# Patient Record
Sex: Female | Born: 1985 | Race: White | Hispanic: No | Marital: Single | Smoking: Never smoker
Health system: Southern US, Community
[De-identification: ages and names within clinical notes are randomized; demographics above are authoritative.]

## PROBLEM LIST (undated history)

## (undated) DIAGNOSIS — K5900 Constipation, unspecified: Secondary | ICD-10-CM

## (undated) DIAGNOSIS — R51 Headache: Secondary | ICD-10-CM

## (undated) DIAGNOSIS — H6690 Otitis media, unspecified, unspecified ear: Secondary | ICD-10-CM

## (undated) DIAGNOSIS — F329 Major depressive disorder, single episode, unspecified: Secondary | ICD-10-CM

## (undated) DIAGNOSIS — R5383 Other fatigue: Secondary | ICD-10-CM

## (undated) DIAGNOSIS — F319 Bipolar disorder, unspecified: Secondary | ICD-10-CM

## (undated) DIAGNOSIS — D649 Anemia, unspecified: Secondary | ICD-10-CM

## (undated) DIAGNOSIS — H73899 Other specified disorders of tympanic membrane, unspecified ear: Secondary | ICD-10-CM

## (undated) DIAGNOSIS — G4733 Obstructive sleep apnea (adult) (pediatric): Secondary | ICD-10-CM

## (undated) DIAGNOSIS — K219 Gastro-esophageal reflux disease without esophagitis: Secondary | ICD-10-CM

## (undated) DIAGNOSIS — R197 Diarrhea, unspecified: Secondary | ICD-10-CM

## (undated) DIAGNOSIS — R000 Tachycardia, unspecified: Secondary | ICD-10-CM

## (undated) DIAGNOSIS — K769 Liver disease, unspecified: Secondary | ICD-10-CM

## (undated) DIAGNOSIS — E282 Polycystic ovarian syndrome: Secondary | ICD-10-CM

## (undated) DIAGNOSIS — R12 Heartburn: Secondary | ICD-10-CM

## (undated) DIAGNOSIS — R519 Headache, unspecified: Secondary | ICD-10-CM

## (undated) DIAGNOSIS — F419 Anxiety disorder, unspecified: Secondary | ICD-10-CM

## (undated) DIAGNOSIS — F32A Depression, unspecified: Secondary | ICD-10-CM

## (undated) DIAGNOSIS — IMO0002 Reserved for concepts with insufficient information to code with codable children: Secondary | ICD-10-CM

## (undated) HISTORY — DX: Anemia, unspecified: D64.9

## (undated) HISTORY — PX: WISDOM TOOTH EXTRACTION: SHX21

## (undated) HISTORY — DX: Depression, unspecified: F32.A

## (undated) HISTORY — PX: COLONOSCOPY: SHX174

## (undated) HISTORY — DX: Major depressive disorder, single episode, unspecified: F32.9

## (undated) HISTORY — DX: Polycystic ovarian syndrome: E28.2

## (undated) HISTORY — DX: Other fatigue: R53.83

## (undated) HISTORY — DX: Gastro-esophageal reflux disease without esophagitis: K21.9

## (undated) HISTORY — DX: Other specified disorders of tympanic membrane, unspecified ear: H73.899

## (undated) HISTORY — DX: Obstructive sleep apnea (adult) (pediatric): G47.33

## (undated) HISTORY — PX: TYMPANOSTOMY TUBE PLACEMENT: SHX32

## (undated) HISTORY — DX: Reserved for concepts with insufficient information to code with codable children: IMO0002

## (undated) HISTORY — PX: UPPER GI ENDOSCOPY: SHX6162

## (undated) HISTORY — DX: Bipolar disorder, unspecified: F31.9

## (undated) HISTORY — DX: Anxiety disorder, unspecified: F41.9

## (undated) HISTORY — DX: Liver disease, unspecified: K76.9

---

## 2006-05-28 ENCOUNTER — Emergency Department (HOSPITAL_COMMUNITY): Admission: EM | Admit: 2006-05-28 | Discharge: 2006-05-28 | Payer: Self-pay | Admitting: Emergency Medicine

## 2006-08-12 ENCOUNTER — Other Ambulatory Visit: Admission: RE | Admit: 2006-08-12 | Discharge: 2006-08-12 | Payer: Self-pay | Admitting: Family Medicine

## 2007-10-14 ENCOUNTER — Other Ambulatory Visit: Admission: RE | Admit: 2007-10-14 | Discharge: 2007-10-14 | Payer: Self-pay | Admitting: Family Medicine

## 2008-07-31 ENCOUNTER — Ambulatory Visit: Payer: Self-pay | Admitting: Family Medicine

## 2010-01-01 ENCOUNTER — Ambulatory Visit: Payer: Self-pay | Admitting: Family Medicine

## 2010-01-01 DIAGNOSIS — D509 Iron deficiency anemia, unspecified: Secondary | ICD-10-CM

## 2010-01-31 ENCOUNTER — Ambulatory Visit: Payer: Self-pay | Admitting: Emergency Medicine

## 2010-01-31 DIAGNOSIS — K219 Gastro-esophageal reflux disease without esophagitis: Secondary | ICD-10-CM | POA: Insufficient documentation

## 2010-03-26 ENCOUNTER — Ambulatory Visit: Payer: Self-pay | Admitting: Family Medicine

## 2010-03-27 ENCOUNTER — Encounter (INDEPENDENT_AMBULATORY_CARE_PROVIDER_SITE_OTHER): Payer: Self-pay | Admitting: *Deleted

## 2010-03-28 ENCOUNTER — Telehealth (INDEPENDENT_AMBULATORY_CARE_PROVIDER_SITE_OTHER): Payer: Self-pay | Admitting: *Deleted

## 2010-07-07 ENCOUNTER — Ambulatory Visit
Admission: RE | Admit: 2010-07-07 | Discharge: 2010-07-07 | Payer: Self-pay | Source: Home / Self Care | Admitting: Family Medicine

## 2010-07-07 ENCOUNTER — Encounter: Payer: Self-pay | Admitting: Family Medicine

## 2010-07-07 LAB — CONVERTED CEMR LAB
Albumin: 5.7 g/dL — ABNORMAL HIGH (ref 3.5–5.2)
Alkaline Phosphatase: 96 units/L (ref 39–117)
Amylase: 25 units/L (ref 0–105)
BUN: 13 mg/dL (ref 6–23)
CO2: 18 meq/L — ABNORMAL LOW (ref 19–32)
Chloride: 102 meq/L (ref 96–112)
Glucose, Urine, Semiquant: NEGATIVE
Nitrite: NEGATIVE
Total Protein: 9.2 g/dL — ABNORMAL HIGH (ref 6.0–8.3)
WBC Urine, dipstick: NEGATIVE

## 2010-07-08 ENCOUNTER — Encounter: Payer: Self-pay | Admitting: Family Medicine

## 2010-07-08 NOTE — Progress Notes (Signed)
  Phone Note Outgoing Call Call back at [facility] Phone [phone]   Call placed by: Lajean Saver RN,  March 28, 2010 10:34 AM Call placed to: Patient Summary of Call: Call back: No answer. Message left on patient's personal cell phone with reason for call and call back with any questions.

## 2010-07-08 NOTE — Assessment & Plan Note (Signed)
Summary: VOMITTING/DIARRHEA 4 DAYS/NH   Vital Signs:  Patient Profile:   25 Years Old Female CC:      Vomiting and Diarrhea x 4 days Height:     60 inches Weight:      174 pounds O2 Sat:      99 % O2 treatment:    Room Air Temp:     98.3 degrees F oral Pulse rate:   90 / minute Pulse rhythm:   regular Resp:     14 per minute BP sitting:   118 / 82  (left arm) Cuff size:   regular  Vitals Entered By: Emilio Math (January 31, 2010 3:46 PM)                  Current Allergies (reviewed today): ! SULFA ! * DILAUDID ! * MRI DYEHistory of Present Illness Chief Complaint: Vomiting and Diarrhea x 4 days History of Present Illness: Diarrhea x4 days, Vomiting today, nausea x2 days.  Mild subj fever and chills.  Not taking any OTC meds. She has a h/o GERD and is out of her meds and is asking for a refill...  has been on Nexium and Aciphex before. The OTC meds like Zantac haven't been working well.  She just started back at [employer].  REVIEW OF SYSTEMS Constitutional Symptoms      Denies fever, chills, night sweats, weight loss, weight gain, and fatigue.  Eyes       Denies change in vision, eye pain, eye discharge, glasses, contact lenses, and eye surgery. Ear/Nose/Throat/Mouth       Denies hearing loss/aids, change in hearing, ear pain, ear discharge, dizziness, frequent runny nose, frequent nose bleeds, sinus problems, sore throat, hoarseness, and tooth pain or bleeding.  Respiratory       Denies dry cough, productive cough, wheezing, shortness of breath, asthma, bronchitis, and emphysema/COPD.  Cardiovascular       Denies murmurs, chest pain, and tires easily with exhertion.    Gastrointestinal       Complains of stomach pain, nausea/vomiting, and diarrhea.      Denies constipation, blood in bowel movements, and indigestion. Genitourniary       Denies painful urination, kidney stones, and loss of urinary control. Neurological       Denies paralysis, seizures, and  fainting/blackouts. Musculoskeletal       Denies muscle pain, joint pain, joint stiffness, decreased range of motion, redness, swelling, muscle weakness, and gout.  Skin       Denies bruising, unusual mles/lumps or sores, and hair/skin or nail changes.  Psych       Denies mood changes, temper/anger issues, anxiety/stress, speech problems, depression, and sleep problems.  Past History:  Past Medical History: Reviewed history from 01/01/2010 and no changes required. Anemia-iron deficiency Depression Bipolar Acid Reflux Slight Bulging disc  Past Surgical History: Reviewed history from 01/01/2010 and no changes required. Wisdom Teeth  Family History: Reviewed history from 01/01/2010 and no changes required. Family History of Anxiety Family History Breast cancer 1st degree relative <50 Family History Diabetes 1st degree relative Family History High cholesterol Family History Hypertension Family History Thyroid disease  Social History: Single Never Smoked Alcohol use-yes - once/twice a mth Drug use-no Regular exercise-no Student Physical Exam General appearance: well developed, well nourished, no acute distress Nasal: mucosa pink, nonedematous, no septal deviation, turbinates normal Oral/Pharynx: tongue normal, posterior pharynx without erythema or exudate Neck: neck supple,  trachea midline, no masses Chest/Lungs: no rales, wheezes, or rhonchi bilateral,  breath sounds equal without effort Heart: regular rate and  rhythm, no murmur Abdomen: epigastric tenderness, Murphy's and Mcburney's neg, no guarding, no rebound, soft Back: no tenderness over musculature, straight leg raises negative bilaterally, deep tendon reflexes 2+ at achilles and patella MSE: oriented to time, place, and person Assessment New Problems: GERD (ICD-530.81) GASTROENTERITIS, ACUTE (ICD-558.9)  likely viral gastroenteritis  Plan New Medications/Changes: PROMETHAZINE HCL 12.5 MG TABS (PROMETHAZINE  HCL) 1 tab by mouth Q6 hours as needed for nausea  #20 x 0, 01/31/2010, Hoyt Koch MD NEXIUM 40 MG CPDR (ESOMEPRAZOLE MAGNESIUM) 1 tab by mouth daily  #30 x 1, 01/31/2010, Hoyt Koch MD  New Orders: Est. Patient Level III [phone]  The patient and/or caregiver has been counseled thoroughly with regard to medications prescribed including dosage, schedule, interactions, rationale for use, and possible side effects and they verbalize understanding.  Diagnoses and expected course of recovery discussed and will return if not improved as expected or if the condition worsens. Patient and/or caregiver verbalized understanding.  Prescriptions: PROMETHAZINE HCL 12.5 MG TABS (PROMETHAZINE HCL) 1 tab by mouth Q6 hours as needed for nausea  #20 x 0   Entered and Authorized by:   Hoyt Koch MD   Signed by:   Hoyt Koch MD on 01/31/2010   Method used:   Print then Give to Patient   RxID:   [id number] NEXIUM 40 MG CPDR (ESOMEPRAZOLE MAGNESIUM) 1 tab by mouth daily  #30 x 1   Entered and Authorized by:   Hoyt Koch MD   Signed by:   Hoyt Koch MD on 01/31/2010   Method used:   Print then Give to Patient   RxID:   [id number]   Patient Instructions: 1)  Parke Simmers diet (nothing spicy, greasy, fried, acidic, or caffeine) for about 5 days. 2)  You may take OTC Imodium for diarrhea, but allow your body to rid itself of the viral infection 3)  Concentrate on hydration with water & Gatorade and maintaining your calories. 4)  Take the Nexium as prescribed 5)  Follow up with a primary care doctor.  If fevers, worsening symptoms, blood in vomit or diarrhea, follow up sooner.  Orders Added: 1)  Est. Patient Level III [59563]

## 2010-07-08 NOTE — Letter (Signed)
Summary: Out of School  [facility]  [address]   [place], [place] [address]   Phone: [phone]  Fax: [phone]    March 27, 2010   Student:  Rhonda Tate    To Whom It May Concern:   For Medical reasons, please excuse the above named student from school for the following dates:  Start:   March 27, 2010  End:     If you need additional information, please feel free to contact our office.   Sincerely,    Lajean Saver RN    ****This is a legal document and cannot be tampered with.  Schools are authorized to verify all information and to do so accordingly.  Appended Document: Out of School Return: March 28, 2010

## 2010-07-08 NOTE — Letter (Signed)
Summary: Out of School  [facility]  [address]   [place], [place] [address]   Phone: [phone]  Fax: [phone]    January 31, 2010   Student:  Rhonda Tate    To Whom It May Concern:   For Medical reasons, please excuse the above named student from school for the following dates:  Start:   January 28, 2010  End:    January 31, 2010  If you need additional information, please feel free to contact our office.   Sincerely,    Hoyt Koch MD    ****This is a legal document and cannot be tampered with.  Schools are authorized to verify all information and to do so accordingly.

## 2010-07-08 NOTE — Assessment & Plan Note (Signed)
Summary: SINUS PROBLEMS/TJ   Vital Signs:  Patient Profile:   25 Years Old Female CC:      HA, Sinus pressure, ear pain x 5 days Height:     60 inches Weight:      179 pounds O2 Sat:      98 % O2 treatment:    Room Air Temp:     98.7 degrees F oral Pulse rate:   83 / minute Resp:     14 per minute BP sitting:   117 / 82  (left arm) Cuff size:   regular  Vitals Entered By: Lajean Saver RN (March 26, 2010 12:17 PM)                  Updated Prior Medication List: DEPO-PROVERA 150 MG/ML SUSP (MEDROXYPROGESTERONE ACETATE) injection q74mths  Current Allergies (reviewed today): ! SULFA ! * DILAUDID ! * MRI DYEHistory of Present Illness Chief Complaint: HA, Sinus pressure, ear pain x 5 days History of Present Illness:  Subjective: Patient complains of onset of bi-temple headache 4 days ago followed by earache and nasal congestion.  She has had soreness in her neck but no sore throat.  She has a history of recurring sinusitis. No cough No pleuritic pain No wheezing + post-nasal drainage ? sinus pain/pressure No itchy/red eyes ? earache No hemoptysis No SOB No fever, + chills No nausea No vomiting No abdominal pain No diarrhea No skin rashes + fatigue No myalgias Used OTC meds without relief   REVIEW OF SYSTEMS Constitutional Symptoms       Complains of night sweats and fatigue.     Denies fever, chills, weight loss, and weight gain.  Eyes       Denies change in vision, eye pain, eye discharge, glasses, contact lenses, and eye surgery. Ear/Nose/Throat/Mouth       Complains of ear pain, sinus problems, and hoarseness.      Denies hearing loss/aids, change in hearing, ear discharge, dizziness, frequent runny nose, frequent nose bleeds, sore throat, and tooth pain or bleeding.  Respiratory       Denies dry cough, productive cough, wheezing, shortness of breath, asthma, bronchitis, and emphysema/COPD.  Cardiovascular       Denies murmurs, chest pain, and tires  easily with exhertion.    Gastrointestinal       Denies stomach pain, nausea/vomiting, diarrhea, constipation, blood in bowel movements, and indigestion. Genitourniary       Denies painful urination, kidney stones, and loss of urinary control. Neurological       Complains of headaches.      Denies paralysis, seizures, and fainting/blackouts. Musculoskeletal       Denies muscle pain, joint pain, joint stiffness, decreased range of motion, redness, swelling, muscle weakness, and gout.  Skin       Denies bruising, unusual mles/lumps or sores, and hair/skin or nail changes.  Psych       Denies mood changes, temper/anger issues, anxiety/stress, speech problems, depression, and sleep problems.  Past History:  Past Medical History: Reviewed history from 01/01/2010 and no changes required. Anemia-iron deficiency Depression Bipolar Acid Reflux Slight Bulging disc  Past Surgical History: Reviewed history from 01/01/2010 and no changes required. Wisdom Teeth  Family History: Reviewed history from 01/01/2010 and no changes required. Family History of Anxiety Family History Breast cancer 1st degree relative <50 Family History Diabetes 1st degree relative Family History High cholesterol Family History Hypertension Family History Thyroid disease  Social History: Reviewed history from 01/31/2010 and no  changes required. Single Never Smoked Alcohol use-yes - once/twice a mth Drug use-no Regular exercise-no Student   Objective:  Appearance:  Patient appears healthy, stated age, and in no acute distress Ears:  Canals normal.  Tympanic membranes normal.   Eyes:  Pupils are equal, round, and reactive to light and accomdation.  Extraocular movement is intact.  Conjunctivae are not inflamed.  Nose:  Normal septum.  Normal turbinates, mildly congested.   No sinus tenderness present.  Pharynx:  Normal  Neck:  Supple.  Slightly tender shotty anterior/posterior nodes are palpated  bilaterally.  Lungs:  Clear to auscultation.  Breath sounds are equal.  Heart:  Regular rate and rhythm without murmurs, rubs, or gallops.  Abdomen:  Nontender without masses or hepatosplenomegaly.  Bowel sounds are present.  No CVA or flank tenderness.  Skin:  No rash Assessment New Problems: VIRAL URI (ICD-465.9)   Plan New Medications/Changes: BENZONATATE 200 MG CAPS (BENZONATATE) One by mouth hs as needed cough  #12 x 0, 03/26/2010, Donna Christen MD AMOXICILLIN 500 MG CAPS (AMOXICILLIN) One capsule by mouth three times a day (Rx void after 04/02/10)  #30 x 0, 03/26/2010, Donna Christen MD  New Orders: Est. Patient Level III [phone] Planning Comments:   Treat symptomatically for now with decongestant/expectorant.  Add cough suppressant at bedtime if necessary.   If not improving in 7 to 10 days, add amoxicillin (given Rx to hold). Follow-up with PCP if not improving.   The patient and/or caregiver has been counseled thoroughly with regard to medications prescribed including dosage, schedule, interactions, rationale for use, and possible side effects and they verbalize understanding.  Diagnoses and expected course of recovery discussed and will return if not improved as expected or if the condition worsens. Patient and/or caregiver verbalized understanding.  Prescriptions: BENZONATATE 200 MG CAPS (BENZONATATE) One by mouth hs as needed cough  #12 x 0   Entered and Authorized by:   Donna Christen MD   Signed by:   Donna Christen MD on 03/26/2010   Method used:   Print then Give to Patient   RxID:   [id number] AMOXICILLIN 500 MG CAPS (AMOXICILLIN) One capsule by mouth three times a day (Rx void after 04/02/10)  #30 x 0   Entered and Authorized by:   Donna Christen MD   Signed by:   Donna Christen MD on 03/26/2010   Method used:   Print then Give to Patient   RxID:   [id number]   Patient Instructions: 1)  May use Mucinex D (guaifenesin with decongestant) twice daily for  congestion. 2)  Increase fluid intake, rest. 3)  May use Afrin nasal spray (or generic oxymetazoline) twice daily for about 5 days.  Also recommend using saline nasal spray several times daily and/or saline nasal irrigation. 4)  Begin amoxicillin if not improving 7 to 10 days. 5)  Followup with family doctor if not improving 10 to 14 days.  Orders Added: 1)  Est. Patient Level III [47425]

## 2010-07-08 NOTE — Assessment & Plan Note (Signed)
Summary: Rash -Upper Back, R arm x 2 dys rm 2   Vital Signs:  Patient Profile:   25 Years Old Female CC:      Rash - Back x 2 dys  Height:     60 inches Weight:      185 pounds O2 Sat:      100 % O2 treatment:    Room Air Temp:     98.7 degrees F oral Pulse rhythm:   regular Resp:     18 per minute (right arm) Cuff size:   regular  Vitals Entered By: Areta Haber CMA (January 01, 2010 5:35 PM)                  Current Allergies (reviewed today): ! SULFA ! * DILAUDID ! * MRI DYE         History of Present Illness Chief Complaint: Rash - Back x 2 dys  History of Present Illness:  Objective:  Patient complains of a pruritic rash that started on her right upper back 3 days ago, and now has a few small lesions on her left upper arm.  She feels well otherwise.  No fevers, chills, and sweats.  No lesions in mouth. She states that she had a tattoo placed on her right upper back 3 weeks ago, and it seemed to be healing nicely.  She [place] two [place] ago.  No hot tub use.  Current Problems: FOLLICULITIS (ICD-704.8) FAMILY HISTORY DIABETES 1ST DEGREE RELATIVE (ICD-V18.0) FAMILY HISTORY BREAST CANCER 1ST DEGREE RELATIVE <50 (ICD-V16.3) DEPRESSION (ICD-311) ANEMIA-IRON DEFICIENCY (ICD-280.9)   Current Meds DEPO-PROVERA 150 MG/ML SUSP (MEDROXYPROGESTERONE ACETATE) injection q37mths SOMA 350 MG TABS (CARISOPRODOL) 1 tab by mouth once daily NEXIUM 20 MG CPDR (ESOMEPRAZOLE MAGNESIUM) 1 tab by mouth once daily VITAMIN B-12 1000 MCG TABS (CYANOCOBALAMIN) 1 tab by mouth once daily SLOW FE 160 (50 FE) MG CR-TABS (FERROUS SULFATE DRIED) 1 tab by mouth once daily BENADRYL 25 MG CAPS (DIPHENHYDRAMINE HCL) as directed CEPHALEXIN 500 MG TABS (CEPHALEXIN) One by mouth three times daily (every 8 hours)  REVIEW OF SYSTEMS Constitutional Symptoms      Denies fever, chills, night sweats, weight loss, weight gain, and fatigue.  Eyes       Denies change in vision, eye  pain, eye discharge, glasses, contact lenses, and eye surgery. Ear/Nose/Throat/Mouth       Denies hearing loss/aids, change in hearing, ear pain, ear discharge, dizziness, frequent runny nose, frequent nose bleeds, sinus problems, sore throat, hoarseness, and tooth pain or bleeding.  Respiratory       Denies dry cough, productive cough, wheezing, shortness of breath, asthma, bronchitis, and emphysema/COPD.  Cardiovascular       Denies murmurs, chest pain, and tires easily with exhertion.    Gastrointestinal       Denies stomach pain, nausea/vomiting, diarrhea, constipation, blood in bowel movements, and indigestion. Genitourniary       Denies painful urination, kidney stones, and loss of urinary control. Neurological       Denies paralysis, seizures, and fainting/blackouts. Musculoskeletal       Denies muscle pain, joint pain, joint stiffness, decreased range of motion, redness, swelling, muscle weakness, and gout.  Skin       Denies bruising, unusual mles/lumps or sores, and hair/skin or nail changes.      Comments: Upper Back x 2 dys Psych       Denies mood changes, temper/anger issues, anxiety/stress, speech problems, depression, and sleep  problems.  Past History:  Past Medical History: Anemia-iron deficiency Depression Bipolar Acid Reflux Slight Bulging disc  Past Surgical History: Wisdom Teeth  Family History: Family History of Anxiety Family History Breast cancer 1st degree relative <50 Family History Diabetes 1st degree relative Family History High cholesterol Family History Hypertension Family History Thyroid disease  Social History: Single Never Smoked Alcohol use-yes - once/twice a mth Drug use-no Regular exercise-no Smoking Status:  never Drug Use:  no Does Patient Exercise:  no   Objective:  No acute distress  Skin: On the right upper back over her scapula is a large tattoo.  Around and on the tattoo are multiple inflamed erythematous follicles, some  with small pustules.  No swelling or warmth. Mouth:  No lesions Neck:  Supple.  No adenopathy is present.  Lungs:  Clear to auscultation.  Breath sounds are equal.  Heart:  Regular rate and rhythm without murmurs, rubs, or gallops.  Assessment New Problems: FOLLICULITIS (ICD-704.8) FAMILY HISTORY DIABETES 1ST DEGREE RELATIVE (ICD-V18.0) FAMILY HISTORY BREAST CANCER 1ST DEGREE RELATIVE <50 (ICD-V16.3) DEPRESSION (ICD-311) ANEMIA-IRON DEFICIENCY (ICD-280.9)   Plan New Medications/Changes: CEPHALEXIN 500 MG TABS (CEPHALEXIN) One by mouth three times daily (every 8 hours)  #30 x 0, 01/01/2010, Donna Christen MD  New Orders: New Patient Level III [phone] Planning Comments:   Begin Keflex.  Benadryl 50mg  q6hr as needed itching. Return for worsening symptoms, or if not improving 2 to 3 days. Given a Water quality scientist patient information and instruction sheet on topic.   The patient and/or caregiver has been counseled thoroughly with regard to medications prescribed including dosage, schedule, interactions, rationale for use, and possible side effects and they verbalize understanding.  Diagnoses and expected course of recovery discussed and will return if not improved as expected or if the condition worsens. Patient and/or caregiver verbalized understanding.  Prescriptions: CEPHALEXIN 500 MG TABS (CEPHALEXIN) One by mouth three times daily (every 8 hours)  #30 x 0   Entered and Authorized by:   Donna Christen MD   Signed by:   Donna Christen MD on 01/01/2010   Method used:   Print then Give to Patient   RxID:   [id number]   Orders Added: 1)  New Patient Level III [95284]

## 2010-07-08 NOTE — Letter (Signed)
Summary: Out of School  [facility]  [address]   [place], [place] [address]   Phone: [phone]  Fax: [phone]    March 26, 2010   Student:  Shanda Bumps Porada    To Whom It May Concern:   For Medical reasons, please excuse the above named student from school today.  If you need additional information, please feel free to contact our office.   Sincerely,    Donna Christen MD    ****This is a legal document and cannot be tampered with.  Schools are authorized to verify all information and to do so accordingly.

## 2010-07-10 ENCOUNTER — Encounter: Payer: Self-pay | Admitting: Family Medicine

## 2010-07-16 NOTE — Letter (Signed)
Summary: Out of Work  [facility]  [address]   [place], [place] [address]   Phone: [phone]  Fax: [phone]    July 07, 2010   Employee:  Kili Stanislawski    To Whom It May Concern:   For Medical reasons, please excuse the above named employee from work yesterday and tomorrow.   If you need additional information, please feel free to contact our office.         Sincerely,    Donna Christen MD

## 2010-07-16 NOTE — Assessment & Plan Note (Signed)
Summary: VOMITING SINCE FRIDAY/SOTMACH PAIN Ottis Stain BETWEEN SHOULDERS (rm2)   Vital Signs:  Patient Profile:   25 Years Old Female CC:      N/V x 3 days, pain b/t shoulder blades x today LMP:     07/04/2010 Height:     60 inches Weight:      170 pounds O2 Sat:      100 % O2 treatment:    Room Air Temp:     97.9 degrees F oral Pulse rate:   116 / minute Resp:     18 per minute BP sitting:   110 / 80  (left arm) Cuff size:   regular  Vitals Entered By: Lajean Saver RN (July 07, 2010 3:54 PM)  Menstrual History: LMP (date): 07/04/2010 LMP - Character: Painful                  Updated Prior Medication List: DEPO-PROVERA 150 MG/ML SUSP (MEDROXYPROGESTERONE ACETATE) injection q37mths  Current Allergies (reviewed today): ! SULFA ! * DILAUDID ! * MRI DYEHistory of Present Illness Chief Complaint: N/V x 3 days, pain b/t shoulder blades x today History of Present Illness:  Subjective:  Patient complains of developing persistent nausea about 4 days ago, followed by persistent abdominal discomfort and vomiting.  One week ago she embarked on a "juice fast" for about two days, and as she began resuming a regular diet her present symptoms developed.  Her present pain is mid-abdomen and radiates to her left back and between shoulder blades.  She denies fevers, chills, and sweats.  She states that she has not had a bowel movement for 1.5 weeks (but there were no changes prior to that).  She states that she has had GERD in the past, improved with Aciphex but she is not presently taking Aciphex.  She states that her present pain is different from past episodes of GERD.  She states that she had a menstrual period 3 days ago which is unusual because she is on Depo-Provera.  She denies pelvic pain, however, and states that she has not been sexually active for many months.  No urinary symptoms.  No respiratory symptoms   REVIEW OF SYSTEMS Constitutional Symptoms      Denies fever, chills, night  sweats, weight loss, weight gain, and fatigue.  Eyes       Denies change in vision, eye pain, eye discharge, glasses, contact lenses, and eye surgery. Ear/Nose/Throat/Mouth       Denies hearing loss/aids, change in hearing, ear pain, ear discharge, dizziness, frequent runny nose, frequent nose bleeds, sinus problems, sore throat, hoarseness, and tooth pain or bleeding.  Respiratory       Denies dry cough, productive cough, wheezing, shortness of breath, asthma, bronchitis, and emphysema/COPD.  Cardiovascular       Denies murmurs, chest pain, and tires easily with exhertion.    Gastrointestinal       Complains of nausea/vomiting and constipation.      Denies stomach pain, diarrhea, blood in bowel movements, and indigestion.      Comments: Last BM 1 1/2 weeks Genitourniary       Denies painful urination, kidney stones, and loss of urinary control. Neurological       Denies paralysis, seizures, and fainting/blackouts. Musculoskeletal       Complains of muscle pain and joint stiffness.      Denies joint pain, decreased range of motion, redness, swelling, muscle weakness, and gout.      Comments: pain b/t shoulder blades  x today Skin       Denies bruising, unusual mles/lumps or sores, and hair/skin or nail changes.  Psych       Denies mood changes, temper/anger issues, anxiety/stress, speech problems, depression, and sleep problems. Other Comments: Patient started a juice diet Monday then felt sick. She stopped tuesday but has since only had water and lemon. She started vomiting friday has continued since up until today. She did eat some mashed potatoes yesterday.    Past History:  Past Medical History: Anemia-iron deficiency Depression Acid Reflux Slight Bulging disc  Past Surgical History: Reviewed history from 01/01/2010 and no changes required. Wisdom Teeth  Family History: Reviewed history from 01/01/2010 and no changes required. Family History of Anxiety Family History Breast  cancer 1st degree relative <50 Family History Diabetes 1st degree relative Family History High cholesterol Family History Hypertension Family History Thyroid disease  Social History: Reviewed history from 01/31/2010 and no changes required. Single Never Smoked Alcohol use-yes - once/twice a mth Drug use-no Regular exercise-no Student   Objective:  Appearance:  Patient appears healthy, stated age, and in no acute distress  Eyes:  Pupils are equal, round, and reactive to light and accomdation.  Extraocular movement is intact.  Conjunctivae are not inflamed.  Mouth:  moist mucous membranes  Pharynx:  Normal  Neck:  Supple.  No adenopathy is present.  No thyromegaly is present  Lungs:  Clear to auscultation.  Breath sounds are equal.  Heart:  Regular rate and rhythm without murmurs, rubs, or gallops.  Abdomen:   Tenderness in the epigastric sub-xiphoid area without masses or hepatosplenomegaly.  Bowel sounds are present.  No CVA or flank tenderness.  Extremities:  No edema.   Skin:  No rash urinalysis (dipstick):  3+ bili, 4+ ket, 2+ blood, neg leuks, neg nit CBC:  WBC 6.7; 34.7 LY, 6.8 MO, 58.5 GR; Hgb 15.9  Assessment New Problems: NAUSEA WITH VOMITING (ICD-787.01) ABDOMINAL PAIN, ACUTE (ICD-789.00)  ? BILIARY COLIC; ? ESOPHAGITIS  Plan New Medications/Changes: ACIPHEX 20 MG TBEC (RABEPRAZOLE SODIUM) One by mouth, once daily ac  #15 x 1, 07/07/2010, Donna Christen MD PROMETHAZINE HCL 25 MG SUPP (PROMETHAZINE HCL) One PR q4 to 6hr as needed for nausea/vomiting  #10 x 0, 07/07/2010, Donna Christen MD  New Orders: Promethazine up to 50mg  [J2550] T-CBC w/Diff [[id number]] T-Comprehensive Metabolic Panel [80053-22900] T-Amylase [82150-23210] T-Lipase [83690-23215] Urinalysis [CPT-81003] Admin of Therapeutic Inj  intramuscular or subcutaneous [96372] Zofran 1mg . injection [J2405] Est. Patient Level IV [56213] Planning Comments:   Unsucessfully attempted IV.  Given  Phenergan 25mg  IM, followed by Zofran 8mg  IM prior to departure No response to GI cocktail.  Amylase, Lipase, and CMP pending.  Given Rx for Phenergan suppositories. [place] Acophex. Continue clear liquids for the remainder of today until nausea/vomiting improve, then gradually advance diet.  If vomiting persists, or abdominal pain increases, recommend proceed to ER.  Follow-up with PCP in one week if improving.   The patient and/or caregiver has been counseled thoroughly with regard to medications prescribed including dosage, schedule, interactions, rationale for use, and possible side effects and they verbalize understanding.  Diagnoses and expected course of recovery discussed and will return if not improved as expected or if the condition worsens. Patient and/or caregiver verbalized understanding.  Prescriptions: ACIPHEX 20 MG TBEC (RABEPRAZOLE SODIUM) One by mouth, once daily ac  #15 x 1   Entered and Authorized by:   Donna Christen MD   Signed by:   Donna Christen MD  on 07/07/2010   Method used:   Print then Give to Patient   RxID:   [id number] PROMETHAZINE HCL 25 MG SUPP (PROMETHAZINE HCL) One PR q4 to 6hr as needed for nausea/vomiting  #10 x 0   Entered and Authorized by:   Donna Christen MD   Signed by:   Donna Christen MD on 07/07/2010   Method used:   Print then Give to Patient   RxID:   [id number]   Medication Administration  Injection # 1:    Medication: Promethazine up to 50mg     Diagnosis: NAUSEA WITH VOMITING (ICD-787.01)    Route: IM    Site: RUOQ gluteus    Exp Date: 08/07/2011    Lot #: [id number]    Mfr: Baxter    Comments: 25mg  given    Patient tolerated injection without complications    Given by: Lajean Saver RN (July 07, 2010 4:55 PM)  Injection # 2:    Medication: Zofran 1mg . injection    Diagnosis: NAUSEA WITH VOMITING (ICD-787.01)    Route: IM    Site: LUOQ gluteus    Exp Date: 08/07/2011    Lot #: [id number]    Mfr: hospira    Comments:  8mg  given    Patient tolerated injection without complications    Given by: Lajean Saver RN (July 07, 2010 6:20 PM)  Orders Added: 1)  Promethazine up to 50mg  [J2550] 2)  T-CBC w/Diff [[id number]] 3)  T-Comprehensive Metabolic Panel [[id number]] 4)  T-Amylase [82150-23210] 5)  T-Lipase [83690-23215] 6)  Urinalysis [CPT-81003] 7)  Admin of Therapeutic Inj  intramuscular or subcutaneous [96372] 8)  Zofran 1mg . injection [J2405] 9)  Est. Patient Level IV [95638]    Laboratory Results   Urine Tests  Date/Time Received: July 07, 2010 4:25 PM  Date/Time Reported: July 07, 2010 4:25 PM   Routine Urinalysis   Color: yellow Appearance: Cloudy Glucose: negative   (Normal Range: Negative) Bilirubin: large   (Normal Range: Negative) Ketone: +4   (Normal Range: Negative) Spec. Gravity: 1.020   (Normal Range: 1.003-1.035) Blood: moderate   (Normal Range: Negative) pH: 6.0   (Normal Range: 5.0-8.0) Protein: 1+   (Normal Range: Negative) Urobilinogen: 0.2   (Normal Range: 0-1) Nitrite: negative   (Normal Range: Negative) Leukocyte Esterace: negative   (Normal Range: Negative)

## 2010-07-16 NOTE — Assessment & Plan Note (Signed)
Summary: Followup Call  Followup call to patient: She reports that she feels better with less nausea and vomiting resolved.  Able to take fluids.  Decreased abdominal pain. Discussed lab results; recommend that she follow-up with PCP or gastroenterologist, especially if symptoms do not resolve within several days.  Continue Aciphex. Donna Christen MD  July 08, 2010 7:58 PM

## 2010-07-16 NOTE — Letter (Signed)
Summary: Internal Other  Internal Other   Imported By: Dannette Barbara 07/08/2010 08:19:40  _____________________________________________________________________  External Attachment:    Type:   Image     Comment:   External Document

## 2010-07-16 NOTE — Letter (Signed)
Summary: Out of School  [facility]  [address]   [place], [place] [address]   Phone: [phone]  Fax: [phone]    July 07, 2010   Student:  Rhonda Tate    To Whom It May Concern:   For Medical reasons, please excuse the above named student from school today and tomorrow.    If you need additional information, please feel free to contact our office.   Sincerely,    Donna Christen MD    ****This is a legal document and cannot be tampered with.  Schools are authorized to verify all information and to do so accordingly.

## 2010-08-13 ENCOUNTER — Other Ambulatory Visit: Payer: Self-pay | Admitting: Obstetrics & Gynecology

## 2010-08-13 ENCOUNTER — Encounter: Payer: PRIVATE HEALTH INSURANCE | Admitting: Obstetrics & Gynecology

## 2010-08-13 DIAGNOSIS — Z01419 Encounter for gynecological examination (general) (routine) without abnormal findings: Secondary | ICD-10-CM

## 2010-08-13 DIAGNOSIS — A630 Anogenital (venereal) warts: Secondary | ICD-10-CM

## 2010-08-13 DIAGNOSIS — Z1272 Encounter for screening for malignant neoplasm of vagina: Secondary | ICD-10-CM

## 2010-08-14 ENCOUNTER — Encounter: Payer: Self-pay | Admitting: Obstetrics & Gynecology

## 2010-08-14 LAB — CONVERTED CEMR LAB
ALT: 86 units/L — ABNORMAL HIGH (ref 0–35)
Albumin: 5.1 g/dL (ref 3.5–5.2)
Alkaline Phosphatase: 80 units/L (ref 39–117)
Indirect Bilirubin: 0.4 mg/dL (ref 0.0–0.9)
Total Protein: 7.8 g/dL (ref 6.0–8.3)

## 2010-08-22 ENCOUNTER — Encounter: Payer: Self-pay | Admitting: Family Medicine

## 2010-08-26 ENCOUNTER — Ambulatory Visit (INDEPENDENT_AMBULATORY_CARE_PROVIDER_SITE_OTHER): Payer: PRIVATE HEALTH INSURANCE | Admitting: Family Medicine

## 2010-08-26 ENCOUNTER — Ambulatory Visit: Payer: PRIVATE HEALTH INSURANCE | Admitting: Family Medicine

## 2010-08-26 DIAGNOSIS — K760 Fatty (change of) liver, not elsewhere classified: Secondary | ICD-10-CM | POA: Insufficient documentation

## 2010-08-26 DIAGNOSIS — R894 Abnormal immunological findings in specimens from other organs, systems and tissues: Secondary | ICD-10-CM

## 2010-08-26 DIAGNOSIS — K7689 Other specified diseases of liver: Secondary | ICD-10-CM

## 2010-08-26 DIAGNOSIS — K8020 Calculus of gallbladder without cholecystitis without obstruction: Secondary | ICD-10-CM

## 2010-08-26 DIAGNOSIS — R768 Other specified abnormal immunological findings in serum: Secondary | ICD-10-CM

## 2010-08-26 DIAGNOSIS — K55039 Acute (reversible) ischemia of large intestine, extent unspecified: Secondary | ICD-10-CM

## 2010-08-26 DIAGNOSIS — F329 Major depressive disorder, single episode, unspecified: Secondary | ICD-10-CM

## 2010-08-26 DIAGNOSIS — K55059 Acute (reversible) ischemia of intestine, part and extent unspecified: Secondary | ICD-10-CM

## 2010-08-26 LAB — POCT UA - MICROALBUMIN: Microalbumin Ur, POC: 10 mg/dL

## 2010-08-26 MED ORDER — FLUOXETINE HCL 10 MG PO CAPS: 10.0000 mg | ORAL_CAPSULE | Freq: Every day | ORAL | Status: DC

## 2010-08-26 NOTE — Assessment & Plan Note (Signed)
Pt agrees to restart meds and is concerned about cost.  She has a good support system.  Start Fluoxetine 10 mg/ day and call back if any problems.  RTC for f/u in 4 wks.

## 2010-08-26 NOTE — Progress Notes (Signed)
  Subjective:    Patient ID: Rhonda Tate, female    DOB: 09-17-1985, 25 y.o.   MRN: [id number]  HPI  25 yo WF presents for abd pain, nausea and vomitting that started in early Feb.  She was hospitalized and had a CT scan. She was diagnosed with ischemic colitis (and a + ANA of 1:640).   She was told to get off the Depo Shot but has not done so yet due to DUB.  She did not have to do surgery.  She was found to have gall stones and fatty liver with elevated liver enzymes.  She is having some vague pleuritic pain but no SOB, joint pains or rashes.  She was previously healthy other than a diagnosis of depression.  She would like to go back on meds.  Did well on SSRIs in the past.       Review of Systems  Constitutional: Positive for fatigue. Negative for fever and unexpected weight change.  Respiratory: Negative for cough, chest tightness, shortness of breath and wheezing.        Mild inspiratory pain  Cardiovascular: Negative for chest pain and palpitations.  Musculoskeletal: Negative for arthralgias.  Neurological: Positive for headaches.  Hematological: Bruises/bleeds easily.  Psychiatric/Behavioral: Positive for dysphoric mood.       Objective:   Physical Exam  Constitutional: She appears well-developed and well-nourished.       obese  Eyes: No scleral icterus.  Neck: Thyromegaly (slight thyromegaly, non tender) present.  Cardiovascular: Normal rate, regular rhythm and normal heart sounds.   Pulmonary/Chest: Effort normal and breath sounds normal. No respiratory distress. She has no wheezes.  Abdominal: Soft. Bowel sounds are normal. She exhibits no distension and no mass. There is tenderness (epigastric and RUQ tenderness with + Murphys sign). There is guarding. There is no rebound.  Musculoskeletal:       No joint effusions  Skin: Skin is warm and dry. There is erythema. No pallor.  Psychiatric:        Flat affected          Assessment & Plan:

## 2010-08-26 NOTE — Assessment & Plan Note (Signed)
Reviewed her CT scan and flex sig from [facility].  Given her young age and ANA 1:640, concerning for hypercoagulable state.  She had labs done and was negative for Factor V Leiden def, Protein C or S deficiency.  Her anticardiolipin ab was not on her [facility] labs, so will recheck along with an anti DS DNA to r/o SLE.

## 2010-08-26 NOTE — Assessment & Plan Note (Signed)
She had gallstones on RUQ u/s done last month.  Having mild RUQ tenderness but no N/V/D at this time.  Plan to get her in with general surgery after she completes her hypercoag. Workup.

## 2010-08-26 NOTE — Patient Instructions (Signed)
Labs downstairs today. Will call you w/ results in 2-3 days.  Work on low fat diet, regular exercise for fatty liver disease.  F/U with GI and gyn.  Start Fluoxetine in the evenings for mood.  Call if any problems.  Return for f/u in 4 wks.

## 2010-08-26 NOTE — Assessment & Plan Note (Signed)
Work on [employer], regular exercise, wt loss and avoid APAP and ETOH.

## 2010-08-27 ENCOUNTER — Ambulatory Visit: Payer: PRIVATE HEALTH INSURANCE | Admitting: Obstetrics & Gynecology

## 2010-08-27 DIAGNOSIS — K7689 Other specified diseases of liver: Secondary | ICD-10-CM

## 2010-08-27 DIAGNOSIS — Z3009 Encounter for other general counseling and advice on contraception: Secondary | ICD-10-CM

## 2010-08-27 DIAGNOSIS — A630 Anogenital (venereal) warts: Secondary | ICD-10-CM

## 2010-08-27 LAB — COMPREHENSIVE METABOLIC PANEL
ALT: 68 U/L — ABNORMAL HIGH (ref 0–35)
Albumin: 4.9 g/dL (ref 3.5–5.2)
Alkaline Phosphatase: 74 U/L (ref 39–117)
BUN: 11 mg/dL (ref 6–23)
Chloride: 102 mEq/L (ref 96–112)
Sodium: 137 mEq/L (ref 135–145)
Total Protein: 7.6 g/dL (ref 6.0–8.3)
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
## 2010-09-11 NOTE — Assessment & Plan Note (Signed)
NAME:  Rhonda Tate, Rhonda Tate NO.:  [id number]  MEDICAL RECORD NO.:  [id number]           PATIENT TYPE:  LOCATION:  [facility] [place]           FACILITY:  PHYSICIAN:  Elsie Lincoln, MD           DATE OF BIRTH:  DATE OF SERVICE:  08/27/2010                                 CLINIC NOTE  The patient is a 25 year old female who presents to discuss birth control options and TCA application.  We did get the notes from her hospitalization and she did have ischemic colitis at the splenic flexure.  She also has extensive fatty liver which hopefully explains her elevated transaminases.  They are still up elevated.  At our labs drawn on August 13, 2010, her AST was 53, her ALT was 86.  All birth control options containing hormones including the Mirena passes to the liver because she is unable to take any of these hormonal methods, hopefully that when she loses weight and her fatty liver goes away, her liver enzymes will normalize, and we can put her back on Depo.  The patient has a problem with anemia when she is not on birth control.  She will start possibly  taking 1 pill of iron a day.  She is aware of the kind of stool softeners if she becomes constipated.  We will check a CBC and B12 level in about 6 weeks.  She also has a history of B12 deficiency with her heavy periods.  I offered her narcotic for the days when her periods are very painful as she cannot take Tylenol or Motrin as she is being worked up for lupus.  This is per her primary care provider.  She refused the narcotic.  She has a history of drug dependence in her family.  She does not want to have a problem with drug dependence.  She is using a heating pad for her painful menses.  We applied TCA to a wart at her mons pubis and it shrank in size to approximately 25%.  We would apply another today and this will require several applications.  We offered her Aldara prescription and described how to use it and she  accepts the Aldara prescription.  She will use this 3 times a week.  HEALTH CARE MAINTENANCE:  The patient is up-to-date on her Pap smear. She is not due for mammogram.  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 1. Heavy menses, cannot be controlled with hormonal methods due to her     elevated transaminases.  The patient has also been worked up for     lupus.  The patient can have a prescription for narcotics if wanted     as described above.  At this time she is in a heating pad. 2. TCA application today with a prescription for Aldara. 3. The patient to come back in 6 weeks for CBC, B12, and evaluation of     the lesions on her mons.          ______________________________ Elsie Lincoln, MD    KL/MEDQ  D:  08/27/2010  T:  08/28/2010  Job:  [id number]

## 2010-09-30 ENCOUNTER — Ambulatory Visit: Payer: PRIVATE HEALTH INSURANCE | Admitting: Family Medicine

## 2010-09-30 ENCOUNTER — Encounter: Payer: Self-pay | Admitting: Family Medicine

## 2010-10-22 ENCOUNTER — Encounter: Payer: Self-pay | Admitting: Family Medicine

## 2010-10-22 ENCOUNTER — Ambulatory Visit (INDEPENDENT_AMBULATORY_CARE_PROVIDER_SITE_OTHER): Payer: PRIVATE HEALTH INSURANCE | Admitting: Obstetrics & Gynecology

## 2010-10-22 ENCOUNTER — Ambulatory Visit (INDEPENDENT_AMBULATORY_CARE_PROVIDER_SITE_OTHER): Payer: PRIVATE HEALTH INSURANCE | Admitting: Family Medicine

## 2010-10-22 VITALS — BP 125/78 | HR 73 | Ht 60.0 in | Wt 167.0 lb

## 2010-10-22 DIAGNOSIS — F329 Major depressive disorder, single episode, unspecified: Secondary | ICD-10-CM

## 2010-10-22 DIAGNOSIS — K7689 Other specified diseases of liver: Secondary | ICD-10-CM

## 2010-10-22 DIAGNOSIS — N915 Oligomenorrhea, unspecified: Secondary | ICD-10-CM

## 2010-10-22 DIAGNOSIS — K55039 Acute (reversible) ischemia of large intestine, extent unspecified: Secondary | ICD-10-CM

## 2010-10-22 DIAGNOSIS — K760 Fatty (change of) liver, not elsewhere classified: Secondary | ICD-10-CM

## 2010-10-22 DIAGNOSIS — K55059 Acute (reversible) ischemia of intestine, part and extent unspecified: Secondary | ICD-10-CM

## 2010-10-22 DIAGNOSIS — N946 Dysmenorrhea, unspecified: Secondary | ICD-10-CM

## 2010-10-22 MED ORDER — FLUOXETINE HCL 20 MG PO CAPS: 20.0000 mg | ORAL_CAPSULE | Freq: Every day | ORAL | Status: DC

## 2010-10-22 NOTE — Assessment & Plan Note (Signed)
Will increase her fluoxetine from 10--> 20 mg/ day.  PHQ 9 score of 4.  She is off birth control, so I told her to keep track of her periods and if late, check a home UPT and call me.  Call if any problems on higher dose of medicine, o/w f/u in 6 wks.

## 2010-10-22 NOTE — Patient Instructions (Signed)
Increase Fluoxetine to 20 mg once daily. Call me if any problems.  Continue the good work on [employer]. Avoid a lot of processed foods.  Avoid fried foods and fatty foods with gallstones.  Will fax a copy of ANA tomorrow [facility].  Return for f/u in 6 wks.

## 2010-10-22 NOTE — Progress Notes (Signed)
  Subjective:    Patient ID: Rhonda Tate, female    DOB: Jun 06, 1986, 25 y.o.   MRN: [id number]  HPI 25 yo WF presents for f/u depression.  I started her on fluoxetine last month for depressed mood following a hospitalization for ischemic colitis.  She is no longer having abd pain and is following with [facility].  She had a CT and flex sig done and thus far a negative anticoagulation w/u.  She did have an elevated ANA at 1:640  And I wanted to recheck her today.  She also has gallstones and fatty liver dz.  She is trying to eat healthy, so this has helped with her symptoms of biliary colic.  She is waiting to see a surgeon until her LFTs come down.  She has lost 15 lbs since starting Weight Watchers.  She is doing some exercise but hopes to do more.  She feels like Fluoxetine 10 mg has not done much for her mood, still feels tired a lot.   BP 125/78  Pulse 73  Ht 5' (1.524 m)  Wt 167 lb (75.751 kg)  BMI 32.62 kg/m2  SpO2 98%  LMP 08/22/2010  Patient Active Problem List  Diagnoses  . ANEMIA-IRON DEFICIENCY  . DEPRESSION  . GERD  . Acute ischemic colitis  . Fatty liver disease, nonalcoholic  . Gallstones     Review of Systems  Constitutional: Positive for fatigue. Negative for fever and appetite change.  Respiratory: Negative for shortness of breath.   Cardiovascular: Negative for chest pain and palpitations.  Gastrointestinal: Negative for abdominal distention.  Genitourinary: Negative for dysuria.  Neurological: Negative for headaches.  Hematological: Does not bruise/bleed easily.  Psychiatric/Behavioral: Positive for dysphoric mood. Negative for suicidal ideas and sleep disturbance. The patient is not nervous/anxious.        Objective:   Physical Exam  Constitutional: She appears well-developed and well-nourished. No distress.       overwt  Eyes: Conjunctivae are normal. No scleral icterus.  Neck: Neck supple. No thyromegaly present.  Cardiovascular:  Normal rate, regular rhythm and normal heart sounds.   Pulmonary/Chest: Effort normal and breath sounds normal. No respiratory distress. She has no wheezes.  Abdominal: Soft. She exhibits no distension. There is no tenderness.  Lymphadenopathy:    She has no cervical adenopathy.  Skin: Skin is warm and dry. No pallor.  Psychiatric: Her mood appears not anxious. She exhibits a depressed mood.          Assessment & Plan:

## 2010-10-22 NOTE — Assessment & Plan Note (Signed)
Improved.  Thus far had a neg w/u for hypercoagulabel state.  Recheck ANA today.

## 2010-10-22 NOTE — Assessment & Plan Note (Signed)
Seeing digestive health specialists.  Doing well with wt loss - down 15 lbs on wt watchers.  Off ETOH and tyelnol containing products.  We discussed cutting back on processed foods and fatty foods (with concurrent gallstones).  She may need a liver biopsy per GI if enzymes are not coming down.

## 2010-10-23 ENCOUNTER — Telehealth: Payer: Self-pay | Admitting: Family Medicine

## 2010-10-23 LAB — ANA: Anti Nuclear Antibody(ANA): NEGATIVE

## 2010-10-23 NOTE — Telephone Encounter (Signed)
Pls let pt know that her ANA came back negative this time.  Marland Kitchen

## 2010-10-23 NOTE — Assessment & Plan Note (Signed)
NAME:  Rhonda Tate, Rhonda Tate NO.:  [id number]  MEDICAL RECORD NO.:  [id number]           PATIENT TYPE:  LOCATION:  [facility] [place]           FACILITY:  PHYSICIAN:  Elsie Lincoln, MD      DATE OF BIRTH:  Feb 28, 1986  DATE OF SERVICE:  10/22/2010                                 CLINIC NOTE  HISTORY OF PRESENT ILLNESS:  The patient is a 25 year old female who is followed up for condyloma and lab work.  The patient also has not had menses since March.  We talked about oligomenorrhea and amenorrhea being at risk for endometrial hyperplasia and cancer.  She needs to have at least one period every 3 months.  Because she was unable to have one on her own, we are going to give her a Provera withdrawal bleed for 10 days.  She still has slightly elevated liver enzymes.  Her gastroenterologist has given 3-4 months to return to normal weight.  If she still has fatty liver and elevated liver enzyme, she will undergo a liver biopsy.  At this point, we are not going to put her on any birth control due to elevated liver enzymes.  I believe that the 10 mg of Provera for 10 days will not significantly elevate her liver enzymes but she needs this for endometrial protection.  Of good news is the wart on the mons is now resolved.  This was at the top before her labia meet and it is completely resolved.  She never had to use the Aldara.  The patient also needs a prescription for narcotic in case she has increased cramping with her menses.  I gave her prescription for Vicoprofen 5/200 for 10 pills to be used as needed.  ASSESSMENT AND PLAN:  This is a 25 year old female with multiple problems. 1. Fatty liver/elevated liver enzymes.  No birth control at this time.     The patient is followed by a gastroenterologist. 2. Oligomenorrhea.  The patient will need to draw progesterone     withdrawal bleed for every 3 months if she cannot produce on her     own.  A prescription for Provera was  given to start early June if     she does not have a period. 3. Dysmenorrhea.  The patient was given 10 pills of Vicoprofen in case     she has extreme pain with her menses. 4. Health care maintenance up-to-date. 5. Return to clinic in 3 months.          ______________________________ Elsie Lincoln, MD    KL/MEDQ  D:  10/22/2010  T:  10/22/2010  Job:  [id number]

## 2010-10-24 NOTE — Telephone Encounter (Signed)
LMOM informing Pt of the above 

## 2010-12-05 ENCOUNTER — Ambulatory Visit: Payer: PRIVATE HEALTH INSURANCE | Admitting: Family Medicine

## 2011-01-08 ENCOUNTER — Encounter: Payer: Self-pay | Admitting: Family Medicine

## 2011-06-06 ENCOUNTER — Emergency Department
Admission: EM | Admit: 2011-06-06 | Discharge: 2011-06-06 | Disposition: A | Discharge status: Home / Self Care | Payer: Self-pay | Source: Home / Self Care | Attending: Family Medicine | Admitting: Family Medicine

## 2011-06-06 DIAGNOSIS — H6690 Otitis media, unspecified, unspecified ear: Secondary | ICD-10-CM

## 2011-06-06 DIAGNOSIS — J0100 Acute maxillary sinusitis, unspecified: Secondary | ICD-10-CM

## 2011-06-06 DIAGNOSIS — H6120 Impacted cerumen, unspecified ear: Secondary | ICD-10-CM

## 2011-06-06 DIAGNOSIS — H6121 Impacted cerumen, right ear: Secondary | ICD-10-CM

## 2011-06-06 HISTORY — DX: Heartburn: R12

## 2011-06-06 HISTORY — DX: Otitis media, unspecified, unspecified ear: H66.90

## 2011-06-06 MED ORDER — HYDROCODONE-ACETAMINOPHEN 5-500 MG PO TABS: 1.0000 | ORAL_TABLET | Freq: Every evening | ORAL | Status: AC | PRN

## 2011-06-06 MED ORDER — AMOXICILLIN 875 MG PO TABS: 875.0000 mg | ORAL_TABLET | Freq: Two times a day (BID) | ORAL | Status: DC

## 2011-06-06 MED ORDER — BENZONATATE 200 MG PO CAPS: 200.0000 mg | ORAL_CAPSULE | Freq: Every day | ORAL | Status: AC

## 2011-06-06 NOTE — ED Provider Notes (Signed)
History     CSN: [id number]  Arrival date & time 06/06/11  1148   First MD Initiated Contact with Patient 06/06/11 1358      Chief Complaint  Patient presents with  . Sinusitis  . Otalgia      HPI Comments: Patient complains of two day history of  headache, nasal congestion, and sore throat.  Last night she developed bilateral earache.  Right ear is more painful today.  She had a fever two days ago.  The history is provided by the patient.    Past Medical History  Diagnosis Date  . Anemia     iron deficiency  . Depression   . Acid reflux   . Bulging disc     slight  . Ear infection   . Heartburn     Past Surgical History  Procedure Date  . Wisdom tooth extraction   . Wisdom tooth extraction     Family History  Problem Relation Age of Onset  . Anxiety disorder Other     family hx of  . Cancer Other     breast/ family hx of  . Diabetes Other     family hx of  . Hyperlipidemia Other     family hx of  . Hypertension Other     family hx of  . Thyroid disease Other     family hx of    History  Substance Use Topics  . Smoking status: Never Smoker   . Smokeless tobacco: Not on file  . Alcohol Use: Yes     once/ twice a month    OB History    Grav Para Term Preterm Abortions TAB SAB Ect Mult Living                  Review of Systems + sore throat No cough No pleuritic pain No wheezing + nasal congestion + post-nasal drainage ? sinus pain/pressure No itchy/red eyes + earache bilateral No hemoptysis No SOB + fever/chills to 101 two days ago No nausea No vomiting No abdominal pain No diarrhea No urinary symptoms No skin rashes + fatigue No myalgias No headache Used OTC meds without relief  Allergies  Hydromorphone hcl and Sulfonamide derivatives  Home Medications   Current Outpatient Rx  Name Route Sig Dispense Refill  . ESTRADIOL CYPIONATE 5 MG/ML IM OIL Intramuscular Inject into the muscle every 28 (twenty-eight) days.      .  AMOXICILLIN 875 MG PO TABS Oral Take 1 tablet (875 mg total) by mouth 2 (two) times daily. 20 tablet 0  . BENZONATATE 200 MG PO CAPS Oral Take 1 capsule (200 mg total) by mouth at bedtime. Take as needed for cough 12 capsule 0  . FLUOXETINE HCL 20 MG PO CAPS Oral Take 1 capsule (20 mg total) by mouth daily. 30 capsule 2  . HYDROCODONE-ACETAMINOPHEN 5-500 MG PO TABS Oral Take 1 tablet by mouth at bedtime as needed for pain. 10 tablet 0  . PROMETHAZINE HCL 25 MG RE SUPP Rectal Place 25 mg rectally every 6 (six) hours as needed. One PR q4 to 6hr prn nausea/ vomiting     . RABEPRAZOLE SODIUM 20 MG PO TBEC Oral Take 20 mg by mouth daily. One po, once daily 30 min ac       BP 113/80  Pulse 85  Temp(Src) 98.4 F (36.9 C) (Oral)  Resp 20  Ht 5' (1.524 m)  Wt 172 lb (78.019 kg)  BMI 33.59 kg/m2  SpO2  100%  Physical Exam Nursing notes and Vital Signs reviewed. Appearance:  Patient appears healthy, stated age, and in no acute distress Eyes:  Pupils are equal, round, and reactive to light and accomodation.  Extraocular movement is intact.  Conjunctivae are not inflamed  Ears:  Right canal occluded with cerumen; left is clear.  Left tympanic membrane is red and bulging with purulent effusion.  Nose:  Moderately congested turbinates worse on right.  Maxillary sinus tenderness is present on right.  Pharynx:  Normal Neck:  Supple.  Slightly tender shotty anterior/posterior nodes are palpated bilaterally  Lungs:  Clear to auscultation.  Breath sounds are equal.  Heart:  Regular rate and rhythm without murmurs, rubs, or gallops.  Abdomen:  Nontender without masses or hepatosplenomegaly.  Bowel sounds are present.  No CVA or flank tenderness.  Extremities:  No edema.  No calf tenderness Skin:  No rash present.   ED Course  Procedures none      1. Otitis media   2. Acute maxillary sinusitis   3. Impacted cerumen of right ear       MDM  Suspect early viral URI with otitis media Begin  amoxicillin.  Lortab at bedtime for pain.  Add Tessalon if cough develops at night. Take Mucinex D (guaifenesin with decongestant) twice daily for congestion.  Increase fluid intake, rest. May use Mucinex nasal spray (or generic oxymetazoline) twice daily for about 5 days.  Also recommend using saline nasal spray several times daily and/or saline nasal irrigation. Stop all antihistamines for now, and other non-prescription cough/cold preparations. May take Ibuprofen 200mg , 4 tabs every 8 hours with food for ear pain. Return in about one week for right ear lavage.         Donna Christen, MD 06/08/11 [id number]

## 2011-06-06 NOTE — ED Notes (Signed)
States sinus pain started two days ago and BL ear pain started last night. Right ear worse then left.

## 2012-04-15 ENCOUNTER — Ambulatory Visit (INDEPENDENT_AMBULATORY_CARE_PROVIDER_SITE_OTHER): Payer: Commercial Managed Care - PPO | Admitting: Physician Assistant

## 2012-04-15 ENCOUNTER — Encounter: Payer: Self-pay | Admitting: Physician Assistant

## 2012-04-15 VITALS — BP 134/76 | HR 88 | Ht 60.0 in | Wt 182.0 lb

## 2012-04-15 DIAGNOSIS — N939 Abnormal uterine and vaginal bleeding, unspecified: Secondary | ICD-10-CM

## 2012-04-15 DIAGNOSIS — L853 Xerosis cutis: Secondary | ICD-10-CM

## 2012-04-15 DIAGNOSIS — R5381 Other malaise: Secondary | ICD-10-CM

## 2012-04-15 DIAGNOSIS — L738 Other specified follicular disorders: Secondary | ICD-10-CM

## 2012-04-15 DIAGNOSIS — R7301 Impaired fasting glucose: Secondary | ICD-10-CM

## 2012-04-15 DIAGNOSIS — R5383 Other fatigue: Secondary | ICD-10-CM

## 2012-04-15 DIAGNOSIS — F329 Major depressive disorder, single episode, unspecified: Secondary | ICD-10-CM

## 2012-04-15 DIAGNOSIS — R7402 Elevation of levels of lactic acid dehydrogenase (LDH): Secondary | ICD-10-CM

## 2012-04-15 DIAGNOSIS — R748 Abnormal levels of other serum enzymes: Secondary | ICD-10-CM

## 2012-04-15 DIAGNOSIS — N926 Irregular menstruation, unspecified: Secondary | ICD-10-CM

## 2012-04-15 DIAGNOSIS — F3289 Other specified depressive episodes: Secondary | ICD-10-CM

## 2012-04-15 DIAGNOSIS — Z862 Personal history of diseases of the blood and blood-forming organs and certain disorders involving the immune mechanism: Secondary | ICD-10-CM

## 2012-04-15 MED ORDER — BUPROPION HCL ER (XL) 150 MG PO TB24: 150.0000 mg | ORAL_TABLET | Freq: Every day | ORAL | Status: DC

## 2012-04-15 MED ORDER — PHENTERMINE HCL 37.5 MG PO CAPS: 37.5000 mg | ORAL_CAPSULE | ORAL | Status: DC

## 2012-04-15 MED ORDER — OMEPRAZOLE 40 MG PO CPDR: 40.0000 mg | DELAYED_RELEASE_CAPSULE | Freq: Every day | ORAL | Status: DC

## 2012-04-15 NOTE — Progress Notes (Signed)
  Subjective:    Patient ID: Rhonda Tate, female    DOB: Jan 23, 1986, 26 y.o.   MRN: [id number]  HPI Patient is a 26 yo female who presents to the clinic to establish care with me today. She saw Dr. Cathey Endow in the past but has not had any insurance and needed to follow back up. She has had an eventful year in 2012. She was hospitalized for Acute ischemic colitis, found that she had a gallbladder that was not functioning well, and liver enzymes were elevated. She has not followed up on any of these conditions. She is on the depo shot because she went off of it and her periods were awful and her liver enzymes remained unchanged.   She had her cholesterol checked at work and LDL was 87. Her fasting glucose was elevated at 107. She does have diabetes that runs in the family with Dad and grandfather.   She really wants addressed why she feels so tired and depressed lately. She complains of sensitivity cold, constipation, dry skin, muscle tenderness, impaired memory and weak nails. She has tried lexapro/cymbalta/prozac in the past and they have either not worked or she started to have bad side effects. She was put on Wellbutrin and Seroquel together but felts really crazy however she felt like Wellbutrin might have worked some. She used klonopin but has family hx of addictive behavior and did not want to go down that road.  She is really frustrated with not being able to lose weight. She tries to exercise and eat balanced meals. She is hungry more than she feels she should be.   She would like liver enzymes followed up on. She needs medication for heartburn.  Review of Systems     Objective:   Physical Exam  Constitutional: She is oriented to person, place, and time. She appears well-developed and well-nourished.       Obese.  HENT:  Head: Normocephalic and atraumatic.  Cardiovascular: Normal rate and normal heart sounds.   Pulmonary/Chest: Effort normal and breath sounds normal. She has no  wheezes.  Neurological: She is alert and oriented to person, place, and time.  Skin: Skin is warm and dry.  Psychiatric: Her behavior is normal.       Very flat affect.           Assessment & Plan:  Fatigue/weight gain/obesity/depression/PTSD- Will check TSH, CBC, A1C and testosterone. Started WEllbutrrin daily follow up in 1 month. Started phentermine follow up 1 month for BP/Pulse rehceck. Encouraged healthy diet and exercise to join forces with phentermine. PT aware will be on it for no longer than 3 months. Discussed counseling to talk about her past and she declined.  GERD- Gave prilosec 40mg  daily.   Elevated liver enzymes- will recheck today to see there trend. They could be some elevated due to fatty liver and medications.

## 2012-04-18 LAB — CBC WITH DIFFERENTIAL/PLATELET
Basophils Absolute: 0 10*3/uL (ref 0.0–0.1)
Eosinophils Relative: 2 % (ref 0–5)
Hemoglobin: 13.5 g/dL (ref 12.0–15.0)
Lymphocytes Relative: 33 % (ref 12–46)
MCHC: 32.2 g/dL (ref 30.0–36.0)
MCV: 90.9 fL (ref 78.0–100.0)
Monocytes Absolute: 0.6 10*3/uL (ref 0.1–1.0)
Monocytes Relative: 8 % (ref 3–12)
Neutro Abs: 4.3 10*3/uL (ref 1.7–7.7)
RDW: 13.8 % (ref 11.5–15.5)
WBC: 7.4 10*3/uL (ref 4.0–10.5)

## 2012-04-18 LAB — HEMOGLOBIN A1C
Hgb A1c MFr Bld: 5.5 % (ref <5.7)
Mean Plasma Glucose: 111 mg/dL (ref <117)

## 2012-04-18 LAB — HEPATIC FUNCTION PANEL
ALT: 58 U/L — ABNORMAL HIGH (ref 0–35)
Albumin: 5.1 g/dL (ref 3.5–5.2)
Alkaline Phosphatase: 54 U/L (ref 39–117)
Total Protein: 7.9 g/dL (ref 6.0–8.3)

## 2012-04-19 ENCOUNTER — Telehealth: Payer: Self-pay | Admitting: Physician Assistant

## 2012-04-19 DIAGNOSIS — F329 Major depressive disorder, single episode, unspecified: Secondary | ICD-10-CM

## 2012-04-19 DIAGNOSIS — L853 Xerosis cutis: Secondary | ICD-10-CM

## 2012-04-19 DIAGNOSIS — E669 Obesity, unspecified: Secondary | ICD-10-CM

## 2012-04-19 DIAGNOSIS — R5383 Other fatigue: Secondary | ICD-10-CM

## 2012-04-19 LAB — TESTOSTERONE, FREE, TOTAL, SHBG
Sex Hormone Binding: 14 nmol/L — ABNORMAL LOW (ref 18–114)
Testosterone: <10 ng/dL — ABNORMAL LOW (ref 10–70)

## 2012-04-19 NOTE — Telephone Encounter (Signed)
Let patient know I am checking for adrenal problems. I would like patient to come in fasting and get cortisol checked along with another hormone level. i also want her to get collection jug from lab and collect urine for 24 hours. Please fax to lab, michelle.

## 2012-04-19 NOTE — Telephone Encounter (Signed)
Order faxed and Pt aware

## 2012-04-20 ENCOUNTER — Ambulatory Visit (INDEPENDENT_AMBULATORY_CARE_PROVIDER_SITE_OTHER): Payer: Commercial Managed Care - PPO | Admitting: Physician Assistant

## 2012-04-20 ENCOUNTER — Encounter: Payer: Self-pay | Admitting: Physician Assistant

## 2012-04-20 VITALS — BP 137/87 | HR 119 | Temp 98.1°F | Wt 179.0 lb

## 2012-04-20 DIAGNOSIS — J329 Chronic sinusitis, unspecified: Secondary | ICD-10-CM

## 2012-04-20 DIAGNOSIS — Z888 Allergy status to other drugs, medicaments and biological substances status: Secondary | ICD-10-CM

## 2012-04-20 DIAGNOSIS — K5900 Constipation, unspecified: Secondary | ICD-10-CM

## 2012-04-20 DIAGNOSIS — Z789 Other specified health status: Secondary | ICD-10-CM

## 2012-04-20 MED ORDER — AMOXICILLIN 875 MG PO TABS: 875.0000 mg | ORAL_TABLET | Freq: Two times a day (BID) | ORAL | Status: AC

## 2012-04-20 NOTE — Patient Instructions (Addendum)
Miralax 1 capful everyday.   Try Mucinex D first and if not better in 4 days can use antibiotic.   Constipation, Adult Constipation is when a person has fewer than 3 bowel movements a week; has difficulty having a bowel movement; or has stools that are dry, hard, or larger than normal. As people grow older, constipation is more common. If you try to fix constipation with medicines that make you have a bowel movement (laxatives), the problem may get worse. Long-term laxative use may cause the muscles of the colon to become weak. A low-fiber diet, not taking in enough fluids, and taking certain medicines may make constipation worse. CAUSES   Certain medicines, such as antidepressants, pain medicine, iron supplements, antacids, and water pills.   Certain diseases, such as diabetes, irritable bowel syndrome (IBS), thyroid disease, or depression.   Not drinking enough water.   Not eating enough fiber-rich foods.   Stress or travel.  Lack of physical activity or exercise.  Not going to the restroom when there is the urge to have a bowel movement.  Ignoring the urge to have a bowel movement.  Using laxatives too much. SYMPTOMS   Having fewer than 3 bowel movements a week.   Straining to have a bowel movement.   Having hard, dry, or larger than normal stools.   Feeling full or bloated.   Pain in the lower abdomen.  Not feeling relief after having a bowel movement. DIAGNOSIS  Your caregiver will take a medical history and perform a physical exam. Further testing may be done for severe constipation. Some tests may include:   A barium enema X-ray to examine your rectum, colon, and sometimes, your small intestine.  A sigmoidoscopy to examine your lower colon.  A colonoscopy to examine your entire colon. TREATMENT  Treatment will depend on the severity of your constipation and what is causing it. Some dietary treatments include drinking more fluids and eating more fiber-rich  foods. Lifestyle treatments may include regular exercise. If these diet and lifestyle recommendations do not help, your caregiver may recommend taking over-the-counter laxative medicines to help you have bowel movements. Prescription medicines may be prescribed if over-the-counter medicines do not work.  HOME CARE INSTRUCTIONS   Increase dietary fiber in your diet, such as fruits, vegetables, whole grains, and beans. Limit high-fat and processed sugars in your diet, such as [place] fries, hamburgers, cookies, candies, and soda.   A fiber supplement may be added to your diet if you cannot get enough fiber from foods.   Drink enough fluids to keep your urine clear or pale yellow.   Exercise regularly or as directed by your caregiver.   Go to the restroom when you have the urge to go. Do not hold it.  Only take medicines as directed by your caregiver. Do not take other medicines for constipation without talking to your caregiver first. SEEK IMMEDIATE MEDICAL CARE IF:   You have bright red blood in your stool.   Your constipation lasts for more than 4 days or gets worse.   You have abdominal or rectal pain.   You have thin, pencil-like stools.  You have unexplained weight loss. MAKE SURE YOU:   Understand these instructions.  Will watch your condition.  Will get help right away if you are not doing well or get worse. Document Released: 02/21/2004 Document Revised: 08/17/2011 Document Reviewed: 04/28/2011 [facility] Patient Information 2013 [place], [place].   Sinusitis Sinusitis is redness, soreness, and swelling (inflammation) of the paranasal sinuses.  Paranasal sinuses are air pockets within the bones of your face (beneath the eyes, the middle of the forehead, or above the eyes). In healthy paranasal sinuses, mucus is able to drain out, and air is able to circulate through them by way of your nose. However, when your paranasal sinuses are inflamed, mucus and air can become  trapped. This can allow bacteria and other germs to grow and cause infection. Sinusitis can develop quickly and last only a short time (acute) or continue over a long period (chronic). Sinusitis that lasts for more than 12 weeks is considered chronic.  CAUSES  Causes of sinusitis include:  Allergies.  Structural abnormalities, such as displacement of the cartilage that separates your nostrils (deviated septum), which can decrease the air flow through your nose and sinuses and affect sinus drainage.  Functional abnormalities, such as when the small hairs (cilia) that line your sinuses and help remove mucus do not work properly or are not present. SYMPTOMS  Symptoms of acute and chronic sinusitis are the same. The primary symptoms are pain and pressure around the affected sinuses. Other symptoms include:  Upper toothache.  Earache.  Headache.  Bad breath.  Decreased sense of smell and taste.  A cough, which worsens when you are lying flat.  Fatigue.  Fever.  Thick drainage from your nose, which often is green and may contain pus (purulent).  Swelling and warmth over the affected sinuses. DIAGNOSIS  Your caregiver will perform a physical exam. During the exam, your caregiver may:  Look in your nose for signs of abnormal growths in your nostrils (nasal polyps).  Tap over the affected sinus to check for signs of infection.  View the inside of your sinuses (endoscopy) with a special imaging device with a light attached (endoscope), which is inserted into your sinuses. If your caregiver suspects that you have chronic sinusitis, one or more of the following tests may be recommended:  Allergy tests.  Nasal culture A sample of mucus is taken from your nose and sent to a lab and screened for bacteria.  Nasal cytology A sample of mucus is taken from your nose and examined by your caregiver to determine if your sinusitis is related to an allergy. TREATMENT  Most cases of acute  sinusitis are related to a viral infection and will resolve on their own within 10 days. Sometimes medicines are prescribed to help relieve symptoms (pain medicine, decongestants, nasal steroid sprays, or saline sprays).  However, for sinusitis related to a bacterial infection, your caregiver will prescribe antibiotic medicines. These are medicines that will help kill the bacteria causing the infection.  Rarely, sinusitis is caused by a fungal infection. In theses cases, your caregiver will prescribe antifungal medicine. For some cases of chronic sinusitis, surgery is needed. Generally, these are cases in which sinusitis recurs more than 3 times per year, despite other treatments. HOME CARE INSTRUCTIONS   Drink plenty of water. Water helps thin the mucus so your sinuses can drain more easily.  Use a humidifier.  Inhale steam 3 to 4 times a day (for example, sit in the bathroom with the shower running).  Apply a warm, moist washcloth to your face 3 to 4 times a day, or as directed by your caregiver.  Use saline nasal sprays to help moisten and clean your sinuses.  Take over-the-counter or prescription medicines for pain, discomfort, or fever only as directed by your caregiver. SEEK IMMEDIATE MEDICAL CARE IF:  You have increasing pain or severe headaches.  You have nausea, vomiting, or drowsiness.  You have swelling around your face.  You have vision problems.  You have a stiff neck.  You have difficulty breathing. MAKE SURE YOU:   Understand these instructions.  Will watch your condition.  Will get help right away if you are not doing well or get worse. Document Released: 05/25/2005 Document Revised: 08/17/2011 Document Reviewed: 06/09/2011 [facility] Patient Information 2013 [place], [place].

## 2012-04-20 NOTE — Progress Notes (Signed)
  Subjective:    Patient ID: Rhonda Tate, female    DOB: Jun 10, 1985, 26 y.o.   MRN: [id number]  HPI Patient is a 26 year old female who presents to the clinic with the sinus pressure for 7 days and possible reaction to medication.  Patient reports that she has had a sinus pressure a little over a week but she did not mention at last visit she talked that would improve. Over the last 2 days it has started to worsen. She denies any fever, chills, cough, shortness of breath, ear pain , or sore throat. She describes the pressure as the and into her jaw. She has not tried anything to make better. She does have a history of sinus infections.  Patient was started on phentermine about 5 days ago. She has had no appetite and has not been able to sleep. Her BP is slightly elevated today. She stopped phentermine yesterday. She started to feel better today and feels like she might have some of her appetite.  Patient has had ongoing constipation for many years. She's never treated with anything prescription for over-the-counter except for occasional laxative. She has not made any diet changes to help with constipation. She denies any blood or mucus in stools.    Review of Systems     Objective:   Physical Exam  Constitutional: She is oriented to person, place, and time. She appears well-developed and well-nourished.       Obese. [employer].  HENT:  Head: Normocephalic and atraumatic.  Right Ear: External ear normal.  Left Ear: External ear normal.  Nose: Nose normal.  Mouth/Throat: Oropharynx is clear and moist. No oropharyngeal exudate.       TMs clear bilaterally.  Maxillary tenderness bilaterally to palpation.  Eyes: Conjunctivae normal are normal.  Neck: Normal range of motion. Neck supple.  Cardiovascular: Regular rhythm and normal heart sounds.        Tachycardia at 119  Pulmonary/Chest: Effort normal and breath sounds normal. She has no wheezes.  Lymphadenopathy:    She has  no cervical adenopathy.  Neurological: She is alert and oriented to person, place, and time.  Skin: Skin is warm and dry.  Psychiatric: She has a normal mood and affect. Her behavior is normal.          Assessment & Plan:  Intolerance to medication/tachycardia-patient was reassured that sounds like this was the phentermine. Patient is to stop phentermine. She was given the choice to go on to a much lower dose of phentermine to try it again. Patient declined. I did tell patient about over-the-counter appetite suppressant called Garcina [place]. She will try this 30 minutes before each meal. I suspect that phentermine might still be in her system and causing her elevated heart rate. We'll continue to monitor.  Sinusitis-I encouraged patient to treat symptomatic for the next 3-4 more days. Encourage Mucinex twice a day. Discussed nettie pot or some type of nasal mucus remover. If not improved in next couple days gave Rx for amoxicillin twice a day for 10 days. Handout given for sinusitis.   Constipation-encouraged patient to try MiraLax 1 cupful once a day and may increase to twice a day if once it has not worked. Encouraged high fiber diet handout given for constipation. Stay hydrated. Followup if not improving.

## 2012-04-21 LAB — INSULIN, FASTING: Insulin fasting, serum: 34 u[IU]/mL — ABNORMAL HIGH (ref 3–28)

## 2012-04-22 ENCOUNTER — Telehealth: Payer: Self-pay | Admitting: Physician Assistant

## 2012-04-22 MED ORDER — METFORMIN HCL 850 MG PO TABS: 850.0000 mg | ORAL_TABLET | Freq: Every day | ORAL | Status: DC

## 2012-04-22 NOTE — Telephone Encounter (Signed)
Metformin is fine to call in.

## 2012-04-22 NOTE — Telephone Encounter (Signed)
Call pt: Lab cortisol was normal. Urine not back yet. Insulin was elevated. Meaning you do have some insulin resistance. That is enough data to go ahead and start you on metformin. It helps with insulin resistance and weight loss. Would you like to try?   If all other labs negative then will give you testosterone to get within normal range and could hopefully help with some of your symptoms.

## 2012-04-26 ENCOUNTER — Telehealth: Payer: Self-pay

## 2012-04-26 NOTE — Telephone Encounter (Signed)
Rhonda Tate is calling about the labs she had on last Wednesday. I cannot see where they have been resulted.

## 2012-04-27 NOTE — Telephone Encounter (Signed)
Unable to reach patient and unable to leave message.

## 2012-04-29 LAB — DHEA: DHEA: 306 ng/dL (ref 102–1185)

## 2012-05-02 ENCOUNTER — Other Ambulatory Visit: Payer: Self-pay | Admitting: Physician Assistant

## 2012-05-02 DIAGNOSIS — R7989 Other specified abnormal findings of blood chemistry: Secondary | ICD-10-CM

## 2012-05-02 NOTE — Telephone Encounter (Signed)
Pt called

## 2012-05-02 NOTE — Progress Notes (Signed)
I sent lab slip down for patient to recheck testosterone next Monday.

## 2012-05-13 ENCOUNTER — Telehealth: Payer: Self-pay

## 2012-05-13 NOTE — Telephone Encounter (Signed)
Rhonda Tate called for results to her testosterone labs. I called the lab and the reagent is on back order. The turn around time is 7 days. Patient aware.

## 2012-05-16 ENCOUNTER — Ambulatory Visit (INDEPENDENT_AMBULATORY_CARE_PROVIDER_SITE_OTHER): Payer: Commercial Managed Care - PPO | Admitting: Physician Assistant

## 2012-05-16 ENCOUNTER — Encounter: Payer: Self-pay | Admitting: Physician Assistant

## 2012-05-16 ENCOUNTER — Telehealth: Payer: Self-pay | Admitting: *Deleted

## 2012-05-16 VITALS — BP 114/71 | HR 89 | Ht 60.0 in | Wt 174.0 lb

## 2012-05-16 DIAGNOSIS — F329 Major depressive disorder, single episode, unspecified: Secondary | ICD-10-CM

## 2012-05-16 DIAGNOSIS — H938X9 Other specified disorders of ear, unspecified ear: Secondary | ICD-10-CM

## 2012-05-16 DIAGNOSIS — E8881 Metabolic syndrome: Secondary | ICD-10-CM | POA: Insufficient documentation

## 2012-05-16 DIAGNOSIS — N898 Other specified noninflammatory disorders of vagina: Secondary | ICD-10-CM

## 2012-05-16 DIAGNOSIS — R1031 Right lower quadrant pain: Secondary | ICD-10-CM

## 2012-05-16 DIAGNOSIS — R7989 Other specified abnormal findings of blood chemistry: Secondary | ICD-10-CM | POA: Insufficient documentation

## 2012-05-16 DIAGNOSIS — F3289 Other specified depressive episodes: Secondary | ICD-10-CM

## 2012-05-16 DIAGNOSIS — N939 Abnormal uterine and vaginal bleeding, unspecified: Secondary | ICD-10-CM

## 2012-05-16 DIAGNOSIS — R109 Unspecified abdominal pain: Secondary | ICD-10-CM

## 2012-05-16 LAB — TESTOSTERONE, FREE, TOTAL, SHBG
Sex Hormone Binding: 16 nmol/L — ABNORMAL LOW (ref 18–114)
Testosterone: 17

## 2012-05-16 MED ORDER — AMBULATORY NON FORMULARY MEDICATION: Status: DC

## 2012-05-16 MED ORDER — BUPROPION HCL ER (XL) 300 MG PO TB24: 300.0000 mg | ORAL_TABLET | Freq: Every day | ORAL | Status: DC

## 2012-05-16 NOTE — Telephone Encounter (Signed)
Patient has ov today with [place]

## 2012-05-16 NOTE — Patient Instructions (Addendum)
Send over script for testosterone and will recheck in 1-2 months.   Increased Wellbutrin to 300mg  daily.   Continue on Metformin.   Will give Qnasal and use with sudafed when feel pressure building.   1200 Calorie Diabetic Diet The 1200 calorie diabetic diet limits calories to 1200 each day. Following this diet and making healthy meal choices can help improve overall health. It controls blood glucose (sugar) levels and can also help lower blood pressure and cholesterol.  SERVING SIZES Measuring foods and serving sizes helps to make sure you are getting the right amount of food. The list below tells how big or small some common serving sizes are.   1 oz.........4 stacked dice.  3 oz........Marland KitchenDeck of cards.  1 tsp.......Marland KitchenTip of little finger.  1 tbs......Marland KitchenMarland KitchenThumb.  2 tbs.......Marland KitchenGolf ball.   cup......Marland KitchenHalf of a fist.  1 cup.......Marland KitchenA fist. GUIDELINES FOR CHOOSING FOODS The goal of this diet is to eat a variety of foods and limit calories to 1200 each day. This can be done by choosing foods that are low in calories and fat. The diet also suggests eating small amounts of food frequently. Doing this helps control your blood glucose levels, so they do not get too high or too low. Each meal or snack may include a protein food source to help you feel more satisfied. Try to eat about the same amount of food around the same time each day. This includes weekend days, travel days, and days off work. Space your meals about 4 to 5 hours apart, and add a snack between them, if you wish.  For example, a daily food plan could include breakfast, a morning snack, lunch, dinner, and an evening snack. Healthy meals and snacks have different types of foods, including whole grains, vegetables, fruits, lean meats, poultry, fish, and dairy products. As you plan your meals, select a variety of foods. Choose from the bread and starch, vegetable, fruit, dairy, and meat/protein groups. Examples of foods from each group  are listed below, with their suggested serving sizes. Use measuring cups and spoons to become familiar with what a healthy portion looks like. Bread and Starch Each serving equals 15 grams of carbohydrate.  1 slice bread.   bagel.   cup cold cereal (unsweetened).   cup hot cereal or mashed potatoes.  1 small potato (size of a computer mouse).   cup cooked pasta or rice.   English muffin.  1 cup broth-based soup.  3 cups of popcorn.  4 to 6 whole-wheat crackers.   cup cooked beans, peas, or corn. Vegetables Each serving equals 5 grams of carbohydrate.   cup cooked vegetables.  1 cup raw vegetables.   cup tomato or vegetable juice. Fruit Each serving equals 15 grams of carbohydrate.  1 small apple or orange.  1  cup watermelon or strawberries.   cup applesauce (no sugar added).  2 tbs raisins.   banana.   cup canned fruit, packed in water or in its own juice.   cup unsweetened fruit juice. Dairy Each serving equals 12 to 15 grams of carbohydrate.  1 cup fat-free milk.  6 oz artificially sweetened yogurt or plain yogurt.  1 cup low-fat buttermilk.  1 cup soy milk.  1 cup almond milk. Meat/Protein  1 large egg.  2 to 3 oz meat, poultry, or fish.   cup low-fat cottage cheese.  1 tbs peanut butter.  1 oz low-fat cheese.   cup tuna, packed in water.   cup tofu. Fat  1 tsp oil.  1 tsp trans-fat-free margarine.  1 tsp butter.  1 tsp mayonnaise.  2 tbs avocado.  1 tbs salad dressing.  1 tbs cream cheese.  2 tbs sour cream. SAMPLE 1200 CALORIE DIET PLAN Breakfast  1 cup fat-free milk (1 carb serving).  1 small orange (1 carb serving).  1 scrambled egg.  1 slice whole-wheat toast (1 carb serving). Lunch  Sandwich  2 slices whole-wheat bread (2 carb servings).  2 oz lean meat.  2 tsp reduced fat mayonnaise.  1 lettuce leaf.  2 slices tomato.  1 cup carrot sticks. Afternoon Snack  1 small apple (1  carb serving).  1 string cheese. Dinner  2 oz meat.  1 small baked potato (1 carb serving).  1 tsp trans-fat-free margarine.  1 cup steamed broccoli.  1 cup fat-free milk (1 carb serving). Evening Snack   small banana (1 carb serving).  6 vanilla wafers (1 carb serving). MEAL PLAN You can use this worksheet to help you make a daily meal plan based on the 1200 calorie diabetic diet suggestions. If you are using this plan to help you control your blood glucose, you may interchange carbohydrate containing foods (dairy, starches, and fruits). Select a variety of fresh foods of varying colors and flavors. The total amount of carbohydrate in your meals or snacks is more important than making sure you include all of the food groups every time you eat. You can choose from approximately this many of the following foods to build your day's meals:  6 Starches.  3 Vegetables.  2 Fruits.  2 Dairy.  4 to 6 oz Meat/Protein.  Up to 3 Fats. Your dietician can use this worksheet to help you decide how many servings and which types of foods are right for you. BREAKFAST Food Group and Servings / Food Choice Starch _________________________________________________________ Dairy __________________________________________________________ Fruit ___________________________________________________________ Meat/Protein____________________________________________________ Fat ____________________________________________________________ LUNCH Food Group and Servings / Food Choice  Starch _________________________________________________________ Meat/Protein ___________________________________________________ Vegetables _____________________________________________________ Fruit __________________________________________________________ Dairy __________________________________________________________ Fat ____________________________________________________________ Rhonda Tate Food Group and  Servings / Food Choice Dairy __________________________________________________________ Fruit ___________________________________________________________ Starch __________________________________________________________ Meat/Protein____________________________________________________ Rhonda Tate Food Group and Servings / Food Choice Starch _________________________________________________________ Meat/Protein ___________________________________________________ Dairy __________________________________________________________ Vegetables _____________________________________________________ Fruit __________________________________________________________ Fat ____________________________________________________________ Rhonda Tate Food Group and Servings / Food Choice Fruit ___________________________________________________________ Meat/Protein ____________________________________________________ Dairy __________________________________________________________ Starch __________________________________________________________ DAILY TOTALS Starches _________________________ Vegetables _______________________ Fruits ____________________________ Dairy ____________________________ Meat/Protein______________________ Fats _____________________________ Document Released: 12/15/2004 Document Revised: 08/17/2011 Document Reviewed: 04/11/2009 Exit[employer] Patient Information 2013 [place], [place].

## 2012-05-16 NOTE — Progress Notes (Signed)
Subjective:    Patient ID: Rhonda Tate, female    DOB: 10-27-1985, 26 y.o.   MRN: [id number]  HPI Patient presents to the clinic to followup on depression and starting Wellbutrin. She also has some questions concerning medication she is on.  Depression-She reports she is doing much better on Wellbutrin. She says her feelings of depression have decreased about 70%. She still finds herself crying for no reason. She remains fatigue and not wanting to do things. She denies any side effects or worsening depression with Wellbutrin.  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 She is tolerating metformin very well. She has lost 13 pounds since her first visit. She has made a lot of dietary changes.  Testosterone flow in female-patient has had 2 low labs of testosterone. [facility] they recommended testosterone cream for females.  Patient has had some problems with lower abdominal cramping and spotting in the last 2 weeks. She has been on the Depo shot for 5 years and not had any bleeding. She denies any vaginal discharge. She denies any low back pain or problems/burning with urination.  Patient also complains of bilateral ear congestion and pressure. She denies any fever, chills, sinus pressure or sore throat. She has cyclical pain that extends behind her ear and down her neck from pressure. She has used a nasal decongestant in it tends to help minimally.  Review of Systems     Objective:   Physical Exam  Constitutional: She is oriented to person, place, and time. She appears well-developed and well-nourished.  HENT:  Head: Normocephalic and atraumatic.  Right Ear: External ear normal.  Left Ear: External ear normal.  Nose: Nose normal.  Mouth/Throat: Oropharynx is clear and moist. No oropharyngeal exudate.       Left ear TM is clear and normal. Right ear TM is bulging with air bubbles present.  Eyes:  Conjunctivae normal are normal.  Neck: Normal range of motion. Neck supple.  Cardiovascular: Normal rate, regular rhythm and normal heart sounds.   Pulmonary/Chest: Effort normal and breath sounds normal.  Abdominal: Soft. Bowel sounds are normal. She exhibits no distension and no mass. There is no tenderness. There is no guarding.  Lymphadenopathy:    She has no cervical adenopathy.  Neurological: She is alert and oriented to person, place, and time.  Skin: Skin is warm and dry.  Psychiatric: She has a normal mood and affect. Her behavior is normal.          Assessment & Plan:  Depression- will increase Wellbutrin to 300 mg once a day. Encouraged patient to continue exercising. Reassured patient that in combination with testosterone therapy may help depression symptoms.  Testosterone but in female-after 2 labs of low testosterone Will start patient on testosterone cream 2% behind the 1.5 mL daily. Patient was told to come in and 2 months for recheck testosterone level. Patient education keep away from small children or animals. Wash hands after application.  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 Reassured patient that she did not have diabetes at this point; however, this does increase her chances of developing diabetes. I did print out a diabetic diet for her to start learning principles of controlling sugar/card intake. Discussed that office had a free last for diabetic to take based on nutrition that she was more than welcome to sign up for. Encouraged patient at some point to talk to a nutritionist about her dietary  needs and how to best prevent diabetes from forming. Will continue to follow it.  Bilateral ear pressure/congestion-I. did give patient sample of q. nasal to try a combination of Sudafed when here start to feel congested. If q. nasal is working she can continue daily therapy or just use when needed. Patient is aware of symptomatic  treatment for ear pressure and has tried a lot of home remedies that have not worked. She has been seen with the ENT in the past and have not remedy her symptoms.  Vaginal bleeding/lower abdominal cramping-discuss with patient the most common cause of bleeding is a drop in progesterone. Since we know this is not the case it would be beneficial for [place] to look at doing a pelvic ultrasound. I suggested we give the one more month until she is up for her diaper shot in the next week to go ahead and get shot and see if bleeding resolves. For pain can take ibuprofen up to 800 mg up to 3 times a day. Cost of imaging is of concern to patient and his another reason in holding off at least one more month before ordering pelvic ultrasound.

## 2012-05-26 ENCOUNTER — Ambulatory Visit (INDEPENDENT_AMBULATORY_CARE_PROVIDER_SITE_OTHER): Payer: Commercial Managed Care - PPO | Admitting: Family Medicine

## 2012-05-26 VITALS — BP 125/70 | HR 90

## 2012-05-26 DIAGNOSIS — Z309 Encounter for contraceptive management, unspecified: Secondary | ICD-10-CM

## 2012-05-26 MED ORDER — MEDROXYPROGESTERONE ACETATE 150 MG/ML IM SUSP
150.0000 mg | Freq: Once | INTRAMUSCULAR | Status: AC
  Administered 2012-05-26: 150 mg via INTRAMUSCULAR

## 2012-05-26 NOTE — Progress Notes (Signed)
  Subjective:    Patient ID: Rhonda Tate, female    DOB: [birth year], 26 y.o.   MRN: [id number]  HPI  Here for Depo Provera Injection; Pt was told by Lesly Rubenstein at her last visit that she could get her shot here.  Review of Systems     Objective:   Physical Exam        Assessment & Plan:  Depo administration log [facility] patient has been getting her shot. Administered vaccine without complications.acm

## 2012-08-17 ENCOUNTER — Ambulatory Visit (INDEPENDENT_AMBULATORY_CARE_PROVIDER_SITE_OTHER): Payer: Commercial Managed Care - PPO | Admitting: Physician Assistant

## 2012-08-17 ENCOUNTER — Ambulatory Visit: Payer: Commercial Managed Care - PPO

## 2012-08-17 VITALS — BP 111/64 | HR 73 | Ht 60.0 in | Wt 174.0 lb

## 2012-08-17 DIAGNOSIS — Z309 Encounter for contraceptive management, unspecified: Secondary | ICD-10-CM

## 2012-08-17 MED ORDER — MEDROXYPROGESTERONE ACETATE 150 MG/ML IM SUSP
150.0000 mg | Freq: Once | INTRAMUSCULAR | Status: AC
  Administered 2012-08-17: 150 mg via INTRAMUSCULAR

## 2012-08-17 NOTE — Progress Notes (Signed)
  Subjective:    Patient ID: Rhonda Tate, female    DOB: 06/11/1985, 27 y.o.   MRN: [id number]  HPI    Review of Systems     Objective:   Physical Exam        Assessment & Plan:  Pt given depo injection with no complications. RTC in 3 months. Tandy Gaw PA-C

## 2012-11-02 ENCOUNTER — Ambulatory Visit (INDEPENDENT_AMBULATORY_CARE_PROVIDER_SITE_OTHER): Payer: Commercial Managed Care - PPO | Admitting: Physician Assistant

## 2012-11-02 VITALS — BP 125/82 | HR 82

## 2012-11-02 DIAGNOSIS — Z3042 Encounter for surveillance of injectable contraceptive: Secondary | ICD-10-CM

## 2012-11-02 DIAGNOSIS — Z3049 Encounter for surveillance of other contraceptives: Secondary | ICD-10-CM

## 2012-11-02 MED ORDER — MEDROXYPROGESTERONE ACETATE 150 MG/ML IM SUSP
150.0000 mg | Freq: Once | INTRAMUSCULAR | Status: AC
  Administered 2012-11-02: 150 mg via INTRAMUSCULAR

## 2012-11-02 NOTE — Progress Notes (Signed)
  Subjective:    Patient ID: Rhonda Tate, female    DOB: 09-13-1985, 27 y.o.   MRN: [id number] Depo injection given. Next injection due Aug 13-27.  HPI    Review of Systems     Objective:   Physical Exam        Assessment & Plan:

## 2012-12-12 ENCOUNTER — Telehealth: Payer: Self-pay | Admitting: Physician Assistant

## 2012-12-12 NOTE — Telephone Encounter (Signed)
Message copied by Jomarie Longs on Mon Dec 12, 2012  5:04 PM ------      Message from: Tandy Gaw L      Created: Thu May 19, 2012 11:18 PM       Repeat U/S right breast. ------

## 2012-12-12 NOTE — Telephone Encounter (Signed)
I have in my notes that we need to repeat right breast ultrasound. Has imaging contacted you? Do I need to place order?

## 2012-12-14 NOTE — Telephone Encounter (Signed)
I don't see anything in chart but note was made as reminder. I guess put under wrong pt.

## 2012-12-14 NOTE — Telephone Encounter (Signed)
Left message on vm for pt to return my call 

## 2012-12-14 NOTE — Telephone Encounter (Signed)
Pt called back & left a vm stating that she's never had a breast ultrasound anywhere.

## 2013-01-18 ENCOUNTER — Ambulatory Visit (INDEPENDENT_AMBULATORY_CARE_PROVIDER_SITE_OTHER): Payer: No Typology Code available for payment source | Admitting: Physician Assistant

## 2013-01-18 ENCOUNTER — Ambulatory Visit: Payer: No Typology Code available for payment source

## 2013-01-18 ENCOUNTER — Encounter: Payer: Self-pay | Admitting: *Deleted

## 2013-01-18 VITALS — BP 133/79 | HR 87 | Wt 184.0 lb

## 2013-01-18 DIAGNOSIS — Z309 Encounter for contraceptive management, unspecified: Secondary | ICD-10-CM

## 2013-01-18 MED ORDER — MEDROXYPROGESTERONE ACETATE 150 MG/ML IM SUSP
150.0000 mg | Freq: Once | INTRAMUSCULAR | Status: AC
  Administered 2013-01-18: 150 mg via INTRAMUSCULAR

## 2013-01-18 NOTE — Progress Notes (Signed)
Patient ID: Rhonda Tate, female   DOB: 11-30-1985, 27 y.o.   MRN: [id number]     Patient was here to get depo shot. It was given in right ventralglute area. There were no complications.

## 2013-04-11 ENCOUNTER — Ambulatory Visit (INDEPENDENT_AMBULATORY_CARE_PROVIDER_SITE_OTHER): Payer: No Typology Code available for payment source | Admitting: Physician Assistant

## 2013-04-11 ENCOUNTER — Encounter: Payer: Self-pay | Admitting: *Deleted

## 2013-04-11 VITALS — BP 135/77 | HR 91 | Wt 190.0 lb

## 2013-04-11 DIAGNOSIS — Z309 Encounter for contraceptive management, unspecified: Secondary | ICD-10-CM

## 2013-04-11 MED ORDER — MEDROXYPROGESTERONE ACETATE 150 MG/ML IM SUSP
150.0000 mg | Freq: Once | INTRAMUSCULAR | Status: AC
  Administered 2013-04-11: 150 mg via INTRAMUSCULAR

## 2013-04-12 NOTE — Progress Notes (Signed)
  Subjective:    Patient ID: Rhonda Tate, female    DOB: 03-05-1986, 27 y.o.   MRN: [id number]  HPI    Review of Systems     Objective:   Physical Exam        Assessment & Plan:  Pt given depo medrol shot for pregnancy without complication. Tandy Gaw PA-C

## 2013-04-13 ENCOUNTER — Other Ambulatory Visit: Payer: Self-pay

## 2013-07-04 ENCOUNTER — Encounter: Payer: Self-pay | Admitting: *Deleted

## 2013-07-04 ENCOUNTER — Ambulatory Visit (INDEPENDENT_AMBULATORY_CARE_PROVIDER_SITE_OTHER): Payer: No Typology Code available for payment source | Admitting: Family Medicine

## 2013-07-04 VITALS — BP 125/74 | HR 89

## 2013-07-04 DIAGNOSIS — Z309 Encounter for contraceptive management, unspecified: Secondary | ICD-10-CM

## 2013-07-04 MED ORDER — MEDROXYPROGESTERONE ACETATE 150 MG/ML IM SUSP
150.0000 mg | Freq: Once | INTRAMUSCULAR | Status: AC
  Administered 2013-07-04: 150 mg via INTRAMUSCULAR

## 2013-07-04 NOTE — Progress Notes (Signed)
   Subjective:    Patient ID: Rhonda SellerJessica Doyel, female    DOB: 13-Feb-1986, 28 y.o.   MRN: [id number] Pt here for depo injection.  Advised pt to make an appt for a cpe since I couldn't find one in her chart.  Injection given IM LUOQ. No complications. Next injection due April 14-28.  Donne AnonAmber Saharah Sherrow, CMA HPI    Review of Systems     Objective:   Physical Exam        Assessment & Plan:

## 2013-09-20 ENCOUNTER — Encounter: Payer: Self-pay | Admitting: Physician Assistant

## 2013-09-20 ENCOUNTER — Ambulatory Visit (INDEPENDENT_AMBULATORY_CARE_PROVIDER_SITE_OTHER): Payer: No Typology Code available for payment source | Admitting: Physician Assistant

## 2013-09-20 VITALS — BP 128/78 | HR 84 | Ht 60.0 in | Wt 189.0 lb

## 2013-09-20 DIAGNOSIS — E669 Obesity, unspecified: Secondary | ICD-10-CM

## 2013-09-20 DIAGNOSIS — F32A Depression, unspecified: Secondary | ICD-10-CM

## 2013-09-20 DIAGNOSIS — F3289 Other specified depressive episodes: Secondary | ICD-10-CM

## 2013-09-20 DIAGNOSIS — Z131 Encounter for screening for diabetes mellitus: Secondary | ICD-10-CM

## 2013-09-20 DIAGNOSIS — Z309 Encounter for contraceptive management, unspecified: Secondary | ICD-10-CM

## 2013-09-20 DIAGNOSIS — F329 Major depressive disorder, single episode, unspecified: Secondary | ICD-10-CM

## 2013-09-20 DIAGNOSIS — Z0000 Encounter for general adult medical examination without abnormal findings: Secondary | ICD-10-CM

## 2013-09-20 DIAGNOSIS — Z13220 Encounter for screening for lipoid disorders: Secondary | ICD-10-CM

## 2013-09-20 MED ORDER — MEDROXYPROGESTERONE ACETATE 150 MG/ML IM SUSP
150.0000 mg | Freq: Once | INTRAMUSCULAR | Status: AC
  Administered 2013-09-20: 150 mg via INTRAMUSCULAR

## 2013-09-20 MED ORDER — VENLAFAXINE HCL ER 37.5 MG PO CP24: 37.5000 mg | ORAL_CAPSULE | Freq: Every day | ORAL | Status: DC

## 2013-09-20 MED ORDER — PHENTERMINE HCL 15 MG PO CAPS: 15.0000 mg | ORAL_CAPSULE | ORAL | Status: DC

## 2013-09-20 MED ORDER — OMEPRAZOLE 40 MG PO CPDR: 40.0000 mg | DELAYED_RELEASE_CAPSULE | Freq: Every day | ORAL | Status: DC

## 2013-09-20 NOTE — Patient Instructions (Signed)

## 2013-09-22 DIAGNOSIS — F329 Major depressive disorder, single episode, unspecified: Secondary | ICD-10-CM | POA: Insufficient documentation

## 2013-09-22 DIAGNOSIS — E669 Obesity, unspecified: Secondary | ICD-10-CM | POA: Insufficient documentation

## 2013-09-22 DIAGNOSIS — F32A Depression, unspecified: Secondary | ICD-10-CM | POA: Insufficient documentation

## 2013-09-22 NOTE — Progress Notes (Signed)
Subjective:     Rhonda SellerJessica Tate is a 28 y.o. female and is here for a comprehensive physical exam. The patient reports problems - she is having worsening depression. she tried wellbutrin last year and seemed to do nothing for symptoms. has not been on medication for anxiety/depression in over a year. Will randomly break down and cry. feels very down most of the time. no pleasure in doing most things. she also is very down about her weight. she has tried decreasing calories, adding exercise and still cannot seem to lose any weight. .  History   Social History  . Marital Status: Single    Spouse Name: N/A    Number of Children: N/A  . Years of Education: N/A   Occupational History  . Not on file.   Social History Main Topics  . Smoking status: Never Smoker   . Smokeless tobacco: Not on file  . Alcohol Use: Yes     Comment: once/ twice a month  . Drug Use: No  . Sexual Activity:    Other Topics Concern  . Not on file   Social History Narrative  . No narrative on file   Health Maintenance  Topic Date Due  . Pap Smear  08/12/2013  . Influenza Vaccine  01/06/2014  . Tetanus/tdap  07/16/2021    The following portions of the patient's history were reviewed and updated as appropriate: allergies, current medications, past family history, past medical history, past social history, past surgical history and problem list.  Review of Systems A comprehensive review of systems was negative.   Objective:    BP 128/78  Pulse 84  Ht 5' (1.524 m)  Wt 189 lb (85.73 kg)  BMI 36.91 kg/m2 General appearance: alert, cooperative and appears stated age obese.  Head: Normocephalic, without obvious abnormality, atraumatic Eyes: conjunctivae/corneas clear. PERRL, EOM's intact. Fundi benign. Ears: normal TM's and external ear canals both ears Nose: Nares normal. Septum midline. Mucosa normal. No drainage or sinus tenderness. Throat: lips, mucosa, and tongue normal; teeth and gums  normal Neck: no adenopathy, no carotid bruit, no JVD, supple, symmetrical, trachea midline and thyroid not enlarged, symmetric, no tenderness/mass/nodules Back: symmetric, no curvature. ROM normal. No CVA tenderness. Lungs: clear to auscultation bilaterally Heart: regular rate and rhythm, S1, S2 normal, no murmur, click, rub or gallop Abdomen: soft, non-tender; bowel sounds normal; no masses,  no organomegaly Extremities: extremities normal, atraumatic, no cyanosis or edema Pulses: 2+ and symmetric Skin: Skin color, texture, turgor normal. No rashes or lesions Lymph nodes: Cervical, supraclavicular, and axillary nodes normal. Neurologic: Grossly normal    Assessment:    Healthy female exam.      Plan:    CPE- pap UPT 2012, normal. Screening labs were given for pt to have drawn. Recommended calcium 1200mg  and vitamin D 800 units.    Obesity- discussed depo shot not helping weight loss, pt can not tolerate OCP. Would like to stay on. Will check TSH. Will try phentermine again. Pt reports she cannot remember if side effects of heart racing were from wellbutrin or phentermine. I stated would try at lower dose 15mg  daily. Discussed side effects and aware if has palpitations to call office.  Follow up in one month. Exercise 3-5 times a week with cardio. Calorie count to 1200 daily.   Contraception management- depo shot given today without complications. Follow up in 3 months.   Depression- will try Effexor daily. Hopefully this will help activate and motivate her more. Discussed potential side  effects. Discussed worsening depression to stop and call office. Will follow up in one month.    See After Visit Summary for Counseling Recommendations

## 2013-10-18 ENCOUNTER — Encounter: Payer: Self-pay | Admitting: Physician Assistant

## 2013-10-18 ENCOUNTER — Ambulatory Visit (INDEPENDENT_AMBULATORY_CARE_PROVIDER_SITE_OTHER): Payer: No Typology Code available for payment source | Admitting: Physician Assistant

## 2013-10-18 VITALS — BP 126/84 | HR 87 | Ht 60.0 in | Wt 182.0 lb

## 2013-10-18 DIAGNOSIS — K5900 Constipation, unspecified: Secondary | ICD-10-CM | POA: Insufficient documentation

## 2013-10-18 DIAGNOSIS — Z6835 Body mass index (BMI) 35.0-35.9, adult: Secondary | ICD-10-CM

## 2013-10-18 DIAGNOSIS — F329 Major depressive disorder, single episode, unspecified: Secondary | ICD-10-CM

## 2013-10-18 DIAGNOSIS — E669 Obesity, unspecified: Secondary | ICD-10-CM

## 2013-10-18 DIAGNOSIS — F3289 Other specified depressive episodes: Secondary | ICD-10-CM

## 2013-10-18 DIAGNOSIS — J309 Allergic rhinitis, unspecified: Secondary | ICD-10-CM

## 2013-10-18 DIAGNOSIS — J302 Other seasonal allergic rhinitis: Secondary | ICD-10-CM | POA: Insufficient documentation

## 2013-10-18 MED ORDER — VENLAFAXINE HCL ER 75 MG PO CP24: 75.0000 mg | ORAL_CAPSULE | Freq: Every day | ORAL | Status: DC

## 2013-10-18 MED ORDER — FEXOFENADINE-PSEUDOEPHED ER 180-240 MG PO TB24: 1.0000 | ORAL_TABLET | Freq: Every day | ORAL | Status: DC

## 2013-10-18 MED ORDER — LINACLOTIDE 145 MCG PO CAPS: 145.0000 ug | ORAL_CAPSULE | Freq: Every day | ORAL | Status: DC

## 2013-10-18 MED ORDER — PHENTERMINE HCL 37.5 MG PO TABS: 37.5000 mg | ORAL_TABLET | Freq: Every day | ORAL | Status: DC

## 2013-10-18 NOTE — Progress Notes (Signed)
   Subjective:    Patient ID: Rhonda Tate, female    DOB: [birth year], 28 y.o.   MRN: [id number]  HPI Pt presents to the clinic with follow up on weight and depression.   Obesity- doing great on phentermine 15mg . Lost 6 lbs. Very motivated to loose more. [facility] and loving it. Denies any insomnia, palliptations or side effects. She thinks that last time is was the wellbutrin that causes side effect instead of phentermine. Would like to increase to full dose.   Depression- symptoms have improved 50 percent. She still remains fatigued and tired feeling. No side effects.   Allergies- she stays on allegra d but expensive and wants rx.   Constipation- she has had constipation since she was a kid. She has tried high fiber diet, water, and miralax. She used miralax once daily for 3 weeks with little to no relief and then one day she has diarrhea all day long. Hx of ischemic colitis. She is not taking anything currently. She has bowel movements 1-2 times a week. Mostly hard. Denies any blood.    Review of Systems  All other systems reviewed and are negative.      Objective:   Physical Exam  Constitutional: She is oriented to person, place, and time. She appears well-developed and well-nourished.  HENT:  Head: Normocephalic and atraumatic.  Cardiovascular: Normal rate, regular rhythm and normal heart sounds.   Pulmonary/Chest: Effort normal and breath sounds normal.  Abdominal: Bowel sounds are normal. There is no tenderness.  Slightly distended hard abdomen. No tenderness or pain.   Neurological: She is alert and oriented to person, place, and time.  Skin: Skin is dry.  Psychiatric: She has a normal mood and affect. Her behavior is normal.          Assessment & Plan:  Obesity/abnormal weight gain- increased phentermine to 37.5mg . Recheck in one month. Continue diet and exercise.   Depression- PHQ-9 was 4. With fatigue ranking the most  positive.  increased to 75mg  daily. Hopefully this will get her to 80-90 percent improved.   Allergies- gave rx for allergra D.   Constipation- since cost is an issue instructed to try miralax one more time. If not improving then try linzess 145mg  daily. SE discussed. Coupon card and new rx given. Follow upin 2 months.

## 2013-10-18 NOTE — Patient Instructions (Signed)

## 2013-10-19 LAB — LIPID PANEL
Cholesterol: 163 mg/dL (ref 0–200)
HDL: 43 mg/dL (ref >39)
LDL CALC: 94 mg/dL (ref 0–99)
Total CHOL/HDL Ratio: 3.8 Ratio
Triglycerides: 131 mg/dL (ref <150)
VLDL: 26 mg/dL (ref 0–40)

## 2013-10-19 LAB — COMPLETE METABOLIC PANEL WITH GFR
ALT: 89 U/L — ABNORMAL HIGH (ref 0–35)
AST: 50 U/L — ABNORMAL HIGH (ref 0–37)
Albumin: 4.7 g/dL (ref 3.5–5.2)
Alkaline Phosphatase: 69 U/L (ref 39–117)
BUN: 7 mg/dL (ref 6–23)
CO2: 27 meq/L (ref 19–32)
CREATININE: 0.7 mg/dL (ref 0.50–1.10)
Calcium: 9.5 mg/dL (ref 8.4–10.5)
Chloride: 104 mEq/L (ref 96–112)
GFR, Est Non African American: >89 mL/min
Glucose, Bld: 105 mg/dL — ABNORMAL HIGH (ref 70–99)
Potassium: 4.3 mEq/L (ref 3.5–5.3)
Sodium: 138 mEq/L (ref 135–145)
Total Bilirubin: 0.6 mg/dL (ref 0.2–1.2)
Total Protein: 7.8 g/dL (ref 6.0–8.3)

## 2013-10-19 LAB — TSH: TSH: 1.863 u[IU]/mL (ref 0.350–4.500)

## 2013-10-25 ENCOUNTER — Telehealth: Payer: Self-pay | Admitting: *Deleted

## 2013-10-25 ENCOUNTER — Other Ambulatory Visit: Payer: No Typology Code available for payment source | Admitting: *Deleted

## 2013-10-25 DIAGNOSIS — R7309 Other abnormal glucose: Secondary | ICD-10-CM

## 2013-10-25 NOTE — Telephone Encounter (Signed)
Called pt and lvm informing her that she can have lab done downstairs and spoke w/Jade and she advised that pt go to lab.Rhonda Tate Rhonda Tate

## 2013-10-26 LAB — HEMOGLOBIN A1C
Hgb A1c MFr Bld: 5.5 % (ref <5.7)
MEAN PLASMA GLUCOSE: 111 mg/dL (ref <117)

## 2013-11-15 ENCOUNTER — Ambulatory Visit (INDEPENDENT_AMBULATORY_CARE_PROVIDER_SITE_OTHER): Payer: No Typology Code available for payment source | Admitting: Physician Assistant

## 2013-11-15 ENCOUNTER — Encounter: Payer: Self-pay | Admitting: *Deleted

## 2013-11-15 VITALS — BP 123/88 | HR 97 | Wt 177.0 lb

## 2013-11-15 DIAGNOSIS — R635 Abnormal weight gain: Secondary | ICD-10-CM

## 2013-11-15 MED ORDER — PHENTERMINE HCL 37.5 MG PO TABS: 37.5000 mg | ORAL_TABLET | Freq: Every day | ORAL | Status: DC

## 2013-11-15 NOTE — Progress Notes (Addendum)
   Subjective:    Patient ID: Rhonda Tate, female    DOB: 1986-02-17, 28 y.o.   MRN: [id number] Pt here for wt check and bp check for Phentermine. Pt has lost 5lbs. No c/o of CP or palpitations.  Meyer Cory, LPN    HPI    Review of Systems     Objective:   Physical Exam        Assessment & Plan:  Abnormal weight gain- patient was given another refill on phentermine. She is down 5 pounds. Encouraged continual persistence with diet and exercise to get the remainder of her weight off. Followup in one month. Vitals are reassuring. Tandy Gaw PA-C

## 2013-12-14 ENCOUNTER — Ambulatory Visit (INDEPENDENT_AMBULATORY_CARE_PROVIDER_SITE_OTHER): Payer: No Typology Code available for payment source | Admitting: Family Medicine

## 2013-12-14 VITALS — BP 137/89 | HR 92 | Temp 98.4°F | Ht 60.0 in | Wt 172.0 lb

## 2013-12-14 DIAGNOSIS — J3089 Other allergic rhinitis: Secondary | ICD-10-CM

## 2013-12-14 DIAGNOSIS — R635 Abnormal weight gain: Secondary | ICD-10-CM

## 2013-12-14 MED ORDER — CETIRIZINE HCL 10 MG PO TABS: 10.0000 mg | ORAL_TABLET | Freq: Every day | ORAL | Status: DC

## 2013-12-14 MED ORDER — PHENTERMINE HCL 37.5 MG PO TABS: 37.5000 mg | ORAL_TABLET | Freq: Every day | ORAL | Status: DC

## 2013-12-14 NOTE — Progress Notes (Signed)
   Subjective:    Patient ID: Sharon SellerJessica Broadus, female    DOB: [birth year], 28 y.o.   MRN: [id number]  HPI    Review of Systems     Objective:   Physical Exam        Assessment & Plan:  Pt came in for weight check for a refill on her Phentermine. Pt is down 5 lbs. Denies cp, headaches, palpitations, abnormal bleeding or mood changes. Pt did state that she cant afford the Allegra that was prescribed for her and would like to see if something different would be prescribed. She has made an appointment on August 16 for a recheck on her weight and for her Depo injection./ Nahuel Wilbert,CMA    Abnormal weight gain-has lost 5 pounds. Tolerating medication well. Up in one month for nurse visit for blood pressure and weight check. Diastolic is little bit borderline so we will keep a close eye on this.   Allergic rhinitis-new perception sent for Zyrtec. But it may be that her insurance doesn't cover anything antihistamine since they are now over-the-counter.  Nani Gasseratherine Metheney, MD

## 2014-01-18 ENCOUNTER — Ambulatory Visit (INDEPENDENT_AMBULATORY_CARE_PROVIDER_SITE_OTHER): Payer: No Typology Code available for payment source | Admitting: Family Medicine

## 2014-01-18 ENCOUNTER — Encounter: Payer: Self-pay | Admitting: Family Medicine

## 2014-01-18 VITALS — BP 133/88 | HR 78 | Temp 98.1°F | Wt 166.0 lb

## 2014-01-18 DIAGNOSIS — R635 Abnormal weight gain: Secondary | ICD-10-CM

## 2014-01-18 DIAGNOSIS — Z3049 Encounter for surveillance of other contraceptives: Secondary | ICD-10-CM

## 2014-01-18 DIAGNOSIS — K5909 Other constipation: Secondary | ICD-10-CM

## 2014-01-18 MED ORDER — PHENTERMINE HCL 37.5 MG PO TABS: 37.5000 mg | ORAL_TABLET | Freq: Every day | ORAL | Status: DC

## 2014-01-18 MED ORDER — MEDROXYPROGESTERONE ACETATE 150 MG/ML IM SUSP
150.0000 mg | Freq: Once | INTRAMUSCULAR | Status: AC
  Administered 2014-01-18: 150 mg via INTRAMUSCULAR

## 2014-01-18 NOTE — Progress Notes (Signed)
Pt came in today for weight check and Depo injection which she toleration well.pt next Depo is due October 29- November 12. Pt denies chest pain, palpitations, ha, SOB, abnormal bleeding or mood changes. Pt is down 6 lbs./Laranda Burkemper,CMA

## 2014-01-18 NOTE — Progress Notes (Signed)
   Subjective:    Patient ID: Sharon SellerJessica Smitherman, female    DOB: 1986/05/18, 28 y.o.   MRN: [id number]  HPI    Review of Systems     Objective:   Physical Exam        Assessment & Plan:  Abnormal weight gain-tolerating phentermine well without any side effects. Down 6 pounds. We'll refill medication. Followup in one month for blood pressure and weight check.  Nani Gasseratherine Teejay Meader, MD

## 2014-01-26 ENCOUNTER — Other Ambulatory Visit: Payer: Self-pay | Admitting: Physician Assistant

## 2014-02-19 ENCOUNTER — Ambulatory Visit (INDEPENDENT_AMBULATORY_CARE_PROVIDER_SITE_OTHER): Payer: No Typology Code available for payment source | Admitting: Physician Assistant

## 2014-02-19 VITALS — BP 126/71 | HR 94 | Wt 164.0 lb

## 2014-02-19 DIAGNOSIS — R635 Abnormal weight gain: Secondary | ICD-10-CM

## 2014-02-19 MED ORDER — PHENTERMINE HCL 37.5 MG PO TABS: 37.5000 mg | ORAL_TABLET | Freq: Every day | ORAL | Status: DC

## 2014-02-19 NOTE — Progress Notes (Signed)
   Subjective:    Patient ID: Rhonda Tate, female    DOB: 1985-08-30, 28 y.o.   MRN: [id number]  HPI  Rhonda Tate is her for a weight and blood pressure check. Denies chest pain, shortness of breath, headaches or medication problems.    Review of Systems     Objective:   Physical Exam        Assessment & Plan:  Rhonda Tate has lost weight. A new prescription of phentermine will be faxed to [facility]. Patient advised to schedule next follow up in one month.

## 2014-03-23 ENCOUNTER — Other Ambulatory Visit: Payer: Self-pay

## 2014-03-26 ENCOUNTER — Ambulatory Visit (INDEPENDENT_AMBULATORY_CARE_PROVIDER_SITE_OTHER): Payer: No Typology Code available for payment source | Admitting: Physician Assistant

## 2014-03-26 ENCOUNTER — Encounter: Payer: Self-pay | Admitting: Physician Assistant

## 2014-03-26 VITALS — BP 119/72 | HR 96 | Ht 60.0 in | Wt 165.0 lb

## 2014-03-26 DIAGNOSIS — G4700 Insomnia, unspecified: Secondary | ICD-10-CM | POA: Insufficient documentation

## 2014-03-26 DIAGNOSIS — F329 Major depressive disorder, single episode, unspecified: Secondary | ICD-10-CM

## 2014-03-26 DIAGNOSIS — F39 Unspecified mood [affective] disorder: Secondary | ICD-10-CM | POA: Insufficient documentation

## 2014-03-26 DIAGNOSIS — F32A Depression, unspecified: Secondary | ICD-10-CM

## 2014-03-26 MED ORDER — VENLAFAXINE HCL ER 150 MG PO CP24: 150.0000 mg | ORAL_CAPSULE | Freq: Every day | ORAL | Status: DC

## 2014-03-26 MED ORDER — QUETIAPINE FUMARATE 50 MG PO TABS: 50.0000 mg | ORAL_TABLET | Freq: Every day | ORAL | Status: DC

## 2014-03-26 NOTE — Progress Notes (Signed)
   Subjective:    Patient ID: Rhonda SellerJessica Tate, female    DOB: Oct 07, 1985, 28 y.o.   MRN: [id number]  HPI Pt presents to the clinic to discuss worsening depression. In July all her symptoms worsen after boyfriend of 3 years left her for another woman 3 days before her birthday. She started seeing a counselor which has really helped. She is also in divorce care. She admits that she has some thoughts she would be better off dead but has made no plans of suicide and infact scared of dying. She also admits to having ups and downs. She has days where she feels abnormally good and get lots done and feels really happy. Would report 30 up days since July. The rest she is really down. She is having a lot of problems sleeping. Bring in fit bit app and shows up and down throughout night approximately 4-5 times and on average 4-6 hours of sleep a night.  In the past has tried and failed wellbutrin, zoloft, lexapro, cymbalta, resperadal.         Review of Systems  All other systems reviewed and are negative.      Objective:   Physical Exam  Constitutional: She is oriented to person, place, and time. She appears well-developed and well-nourished.  HENT:  Head: Normocephalic and atraumatic.  Cardiovascular: Normal rate, regular rhythm and normal heart sounds.   Pulmonary/Chest: Effort normal and breath sounds normal.  Neurological: She is alert and oriented to person, place, and time.  Skin: Skin is dry.  Psychiatric: She has a normal mood and affect. Her behavior is normal.  Flat affect          Assessment & Plan:  Depression/insomnia/mood disorder- PHQ-9 was 20. Discussed I would like for her to see psych. she has some questionable mood symptoms that could represent bipolar with depression. There has been problems with that in the past with insurance approval for psychiatrist. Will make referral today. Will add seroquel today to effexor at increased dose of 150mg  extended release. Pt has tried  multiple medication with no improvement. Follow up in 4-6 weeks unless in with psychiatrist. Continue with counselor.   Spent 30 minutes with patient and greater than 50 percent of visit spent counseling patient on depression.   Pt declined flu shot today.

## 2014-03-26 NOTE — Patient Instructions (Signed)
Increase effexor 150mg  once a day.  seroquel 50mg  at night.

## 2014-04-26 ENCOUNTER — Encounter (HOSPITAL_COMMUNITY): Payer: Self-pay | Admitting: Physician Assistant

## 2014-04-26 ENCOUNTER — Ambulatory Visit (INDEPENDENT_AMBULATORY_CARE_PROVIDER_SITE_OTHER): Payer: 59 | Admitting: Physician Assistant

## 2014-04-26 VITALS — BP 128/83 | HR 95 | Ht 60.0 in | Wt 173.0 lb

## 2014-04-26 DIAGNOSIS — F329 Major depressive disorder, single episode, unspecified: Secondary | ICD-10-CM

## 2014-04-26 DIAGNOSIS — F419 Anxiety disorder, unspecified: Secondary | ICD-10-CM

## 2014-04-26 DIAGNOSIS — F32A Depression, unspecified: Secondary | ICD-10-CM

## 2014-04-26 MED ORDER — VENLAFAXINE HCL ER 150 MG PO CP24: 150.0000 mg | ORAL_CAPSULE | Freq: Every day | ORAL | Status: DC

## 2014-04-26 MED ORDER — TRAZODONE 25 MG HALF TABLET: 25.0000 mg | ORAL_TABLET | Freq: Every day | ORAL | Status: DC

## 2014-04-27 DIAGNOSIS — R000 Tachycardia, unspecified: Secondary | ICD-10-CM

## 2014-04-27 HISTORY — DX: Tachycardia, unspecified: R00.0

## 2014-05-02 ENCOUNTER — Emergency Department (HOSPITAL_BASED_OUTPATIENT_CLINIC_OR_DEPARTMENT_OTHER)
Admission: EM | Admit: 2014-05-02 | Discharge: 2014-05-02 | Disposition: A | Discharge status: Home / Self Care | Payer: 59 | Attending: Emergency Medicine | Admitting: Emergency Medicine

## 2014-05-02 ENCOUNTER — Telehealth (HOSPITAL_COMMUNITY): Payer: Self-pay | Admitting: *Deleted

## 2014-05-02 ENCOUNTER — Encounter (HOSPITAL_BASED_OUTPATIENT_CLINIC_OR_DEPARTMENT_OTHER): Payer: Self-pay

## 2014-05-02 ENCOUNTER — Telehealth (HOSPITAL_COMMUNITY): Payer: Self-pay

## 2014-05-02 DIAGNOSIS — Z79899 Other long term (current) drug therapy: Secondary | ICD-10-CM | POA: Diagnosis not present

## 2014-05-02 DIAGNOSIS — K219 Gastro-esophageal reflux disease without esophagitis: Secondary | ICD-10-CM | POA: Diagnosis not present

## 2014-05-02 DIAGNOSIS — R110 Nausea: Secondary | ICD-10-CM | POA: Insufficient documentation

## 2014-05-02 DIAGNOSIS — F419 Anxiety disorder, unspecified: Secondary | ICD-10-CM | POA: Diagnosis not present

## 2014-05-02 DIAGNOSIS — Z3202 Encounter for pregnancy test, result negative: Secondary | ICD-10-CM | POA: Diagnosis not present

## 2014-05-02 DIAGNOSIS — R109 Unspecified abdominal pain: Secondary | ICD-10-CM | POA: Diagnosis present

## 2014-05-02 DIAGNOSIS — Z862 Personal history of diseases of the blood and blood-forming organs and certain disorders involving the immune mechanism: Secondary | ICD-10-CM | POA: Insufficient documentation

## 2014-05-02 DIAGNOSIS — Z8669 Personal history of other diseases of the nervous system and sense organs: Secondary | ICD-10-CM | POA: Insufficient documentation

## 2014-05-02 DIAGNOSIS — F319 Bipolar disorder, unspecified: Secondary | ICD-10-CM | POA: Insufficient documentation

## 2014-05-02 DIAGNOSIS — R197 Diarrhea, unspecified: Secondary | ICD-10-CM | POA: Diagnosis not present

## 2014-05-02 LAB — URINALYSIS, ROUTINE W REFLEX MICROSCOPIC
BILIRUBIN URINE: NEGATIVE
GLUCOSE, UA: NEGATIVE mg/dL
HGB URINE DIPSTICK: NEGATIVE
Ketones, ur: NEGATIVE mg/dL
Leukocytes, UA: NEGATIVE
Nitrite: NEGATIVE
Protein, ur: NEGATIVE mg/dL
Specific Gravity, Urine: 1.027 (ref 1.005–1.030)
UROBILINOGEN UA: 1 mg/dL (ref 0.0–1.0)
pH: 6 (ref 5.0–8.0)

## 2014-05-02 LAB — CBC WITH DIFFERENTIAL/PLATELET
BASOS ABS: 0 10*3/uL (ref 0.0–0.1)
BASOS PCT: 0 % (ref 0–1)
EOS ABS: 0 10*3/uL (ref 0.0–0.7)
EOS PCT: 0 % (ref 0–5)
HCT: 38 % (ref 36.0–46.0)
Hemoglobin: 12.7 g/dL (ref 12.0–15.0)
Lymphocytes Relative: 33 % (ref 12–46)
Lymphs Abs: 1.7 10*3/uL (ref 0.7–4.0)
MCH: 29 pg (ref 26.0–34.0)
MCHC: 33.4 g/dL (ref 30.0–36.0)
MCV: 86.8 fL (ref 78.0–100.0)
Monocytes Absolute: 0.5 10*3/uL (ref 0.1–1.0)
Monocytes Relative: 9 % (ref 3–12)
NEUTROS PCT: 58 % (ref 43–77)
Neutro Abs: 3 10*3/uL (ref 1.7–7.7)
PLATELETS: 305 10*3/uL (ref 150–400)
RBC: 4.38 MIL/uL (ref 3.87–5.11)
RDW: 13.1 % (ref 11.5–15.5)
WBC: 5.2 10*3/uL (ref 4.0–10.5)

## 2014-05-02 LAB — COMPREHENSIVE METABOLIC PANEL
ALT: 21 U/L (ref 0–35)
AST: 21 U/L (ref 0–37)
Albumin: 4.4 g/dL (ref 3.5–5.2)
Alkaline Phosphatase: 79 U/L (ref 39–117)
Anion gap: 15 (ref 5–15)
BILIRUBIN TOTAL: 0.2 mg/dL — AB (ref 0.3–1.2)
BUN: 12 mg/dL (ref 6–23)
CALCIUM: 10.1 mg/dL (ref 8.4–10.5)
CHLORIDE: 103 meq/L (ref 96–112)
CO2: 24 meq/L (ref 19–32)
Creatinine, Ser: 0.8 mg/dL (ref 0.50–1.10)
GLUCOSE: 96 mg/dL (ref 70–99)
Potassium: 3.5 mEq/L — ABNORMAL LOW (ref 3.7–5.3)
Sodium: 142 mEq/L (ref 137–147)
Total Protein: 8.2 g/dL (ref 6.0–8.3)

## 2014-05-02 LAB — PREGNANCY, URINE: PREG TEST UR: NEGATIVE

## 2014-05-02 LAB — LIPASE, BLOOD: LIPASE: 42 U/L (ref 11–59)

## 2014-05-02 MED ORDER — POTASSIUM CHLORIDE CRYS ER 20 MEQ PO TBCR
40.0000 meq | EXTENDED_RELEASE_TABLET | Freq: Once | ORAL | Status: AC
  Administered 2014-05-02: 40 meq via ORAL
  Filled 2014-05-02: qty 2

## 2014-05-02 NOTE — Telephone Encounter (Signed)
Pt is currently been seen in urgent care by Dr. Doug SouSam Jacubowitz. Per Dr. Ethelda ChickJacubowitz, pt appears to have a stomach virus, and vital signs are normal. Pt appears to have an onset of muscle aches and night sweats since starting Trazodone. Dr. Ethelda ChickJacubowitz states he will be taking pt off Trazodone effective 11/25. Pt should follow up with Verne SpurrNeil Mashburn, PA-C.

## 2014-05-02 NOTE — ED Provider Notes (Signed)
CSN: [id number]     Arrival date & time 05/02/14  1538 History   First MD Initiated Contact with Patient 05/02/14 1546     Chief Complaint  Patient presents with  . Abdominal Pain     (Consider location/radiation/quality/duration/timing/severity/associated sxs/prior Treatment) HPI Mid abdominal pain onset 04/26/2014 crampy in nature accompanied by nausea and diarrhea. She's had 3 or 4 episodes of diarrhea per day since 04/26/2014. She no vomiting no fever. Symptoms started after she started on trazodone on 04/26/2014. She presently complains of nausea. Symptoms are worse with eating, improved with bowel movements. No blood per rectum no fever pain is crampy and mild.. No other associated symptoms. No treatment prior to coming here. She [facility] who advised her to come to the emergency department for evaluation Past Medical History  Diagnosis Date  . Anemia     iron deficiency  . Depression   . Acid reflux   . Bulging disc     slight  . Ear infection   . Heartburn   . Anxiety   . Bipolar disorder     year 2009  . Liver disease     Fatty Liver Disease 2012  . Fatigue    Past Surgical History  Procedure Laterality Date  . Wisdom tooth extraction    . Wisdom tooth extraction     Family History  Problem Relation Age of Onset  . Anxiety disorder Other     family hx of  . Cancer Other     breast/ family hx of  . Diabetes Other     family hx of  . Hyperlipidemia Other     family hx of  . Hypertension Other     family hx of  . Thyroid disease Other     family hx of   History  Substance Use Topics  . Smoking status: Never Smoker   . Smokeless tobacco: Not on file  . Alcohol Use: Yes     Comment: once/ twice a month   OB History    No data available     Review of Systems  Gastrointestinal: Positive for nausea, abdominal pain and diarrhea.  All other systems reviewed and are negative.     Allergies   Hydromorphone hcl; Hydromorphone hcl; Iodinated diagnostic agents; Sulfonamide derivatives; Wellbutrin; Sulfa antibiotics; and Tape  Home Medications   Prior to Admission medications   Medication Sig Start Date End Date Taking? Authorizing Provider  AMBULATORY NON FORMULARY MEDICATION Medication Name: Testosterone Cream 2%  1/362mL behind knee daily. 05/16/12   Jade L Breeback, PA-C  b complex vitamins capsule Take 1 capsule by mouth daily.    Historical Provider, MD  calcium carbonate (OS-CAL) 600 MG TABS Take 600 mg by mouth 2 (two) times daily with a meal.    Historical Provider, MD  cetirizine (ZYRTEC) 10 MG tablet Take 1 tablet (10 mg total) by mouth at bedtime. 12/14/13   Agapito Gamesatherine D Metheney, MD  Cranberry 500 MG CAPS Take by mouth.    Historical Provider, MD  fish oil-omega-3 fatty acids 1000 MG capsule Take 2 g by mouth daily.    Historical Provider, MD  Lactobacillus-Inulin (CULTURELLE DIGESTIVE HEALTH PO) Take by mouth.    Historical Provider, MD  medroxyPROGESTERone (DEPO-PROVERA) 150 MG/ML injection Inject 150 mg into the muscle every 3 (three) months.    Historical Provider, MD  medroxyPROGESTERone (DEPO-SUBQ PROVERA 104) 104 MG/0.65ML injection Inject 104 mg into the skin.  Historical Provider, MD  Multiple Vitamins-Minerals (ALIVE WOMENS ENERGY) TABS Take by mouth.    Historical Provider, MD  omeprazole (PRILOSEC) 40 MG capsule Take 1 capsule (40 mg total) by mouth daily. 09/20/13   Jade L Breeback, PA-C  Pamabrom 50 MG CAPS Take by mouth.    Historical Provider, MD  QUEtiapine (SEROQUEL) 50 MG tablet Take 1 tablet (50 mg total) by mouth at bedtime. 03/26/14   Jade L Breeback, PA-C  traZODone (DESYREL) 25 mg TABS tablet Take 0.5 tablets (25 mg total) by mouth at bedtime. 04/26/14   Verne SpurrNeil Mashburn, PA-C  venlafaxine XR (EFFEXOR-XR) 150 MG 24 hr capsule Take 1 capsule (150 mg total) by mouth daily with breakfast. 04/26/14   Verne SpurrNeil Mashburn, PA-C  venlafaxine XR (EFFEXOR-XR) 75 MG 24 hr  capsule  02/26/14   Historical Provider, MD  vitamin C (ASCORBIC ACID) 500 MG tablet Take 500 mg by mouth daily.    Historical Provider, MD   BP 142/84 mmHg  Temp(Src) 99.1 F (37.3 C) (Oral)  Resp 16  Ht 5' (1.524 m)  Wt 163 lb (73.936 kg)  BMI 31.83 kg/m2  SpO2 100% Physical Exam  Constitutional: She appears well-developed and well-nourished.  HENT:  Head: Normocephalic and atraumatic.  Eyes: Conjunctivae are normal. Pupils are equal, round, and reactive to light.  Neck: Neck supple. No tracheal deviation present. No thyromegaly present.  Cardiovascular: Normal rate and regular rhythm.   No murmur heard. Pulmonary/Chest: Effort normal and breath sounds normal.  Abdominal: Soft. Bowel sounds are normal. She exhibits no distension and no mass. There is tenderness. There is no rebound and no guarding.  Obese, minimally tender diffusely  Musculoskeletal: Normal range of motion. She exhibits no edema or tenderness.  Neurological: She is alert. Coordination normal.  Skin: Skin is warm and dry. No rash noted.  Psychiatric: She has a normal mood and affect.  Nursing note and vitals reviewed.   ED Course  Procedures (including critical care time) Labs Review Labs Reviewed - No data to display  Imaging Review No results found.   EKG Interpretation None     6 PM patient denies nausea she still complains of mild abdominal discomfort, diffuse. She declines pain medicine. MDM  Strongly doubt serotonin syndrome no tachycardia and no hypertension patient without adrenergic-type symptoms. In light of the fact that symptoms began after starting trazodone we will discontinue trazodone. She takes only for sleep. I [facility] who advised her of discontinuing trazodone. Plan Emetrol for nausea and Imodium for diarrhea avoid milk products. She'll receive potassium 40 mEq orally prior to discharge Diagnosis #1 diarrhea #2 non specific abdominal  pain #3 hypokalemia Final diagnoses:  None        Doug SouSam Berneda Piccininni, MD 05/02/14 1811

## 2014-05-02 NOTE — ED Notes (Signed)
C/o abd pain n/d, bosy aches-started after taking new med trazadone on 11/19-was advised by mental health MD to come to ED for possible serotonin syndrome due to pt also takes effexor

## 2014-05-02 NOTE — Telephone Encounter (Signed)
PT states ever since she started taking Trazodone she has been having stomach issues, muscle aches and night sweats. Please call pt to advise. She is also taking Effexor and is concerned about the interaction between the two medications.

## 2014-05-02 NOTE — Telephone Encounter (Signed)
Return call to pt. Informed pt she is having an adverse reaction to the medication and the pt should to proceed to emergency room, per Verne SpurrNeil Mashburn, PA-C. Pt states and shows understanding.

## 2014-05-02 NOTE — ED Notes (Signed)
MD at bedside. 

## 2014-05-02 NOTE — Discharge Instructions (Signed)
Diarrhea Take Imodium as directed for diarrhea. Use Emetrol as needed for nausea. Avoid milk or foods containing milk such as cheese or ice cream all having diarrhea. See your primary care physician if not better by next week. Is okay to take Tylenol as directed for abdominal discomfort. Return for worsening pain, vomiting, or if you feel worse for any reason. Stop trazodone Diarrhea is watery poop (stool). It can make you feel weak, tired, thirsty, or give you a dry mouth (signs of dehydration). Watery poop is a sign of another problem, most often an infection. It often lasts 2-3 days. It can last longer if it is a sign of something serious. Take care of yourself as told by your doctor. HOME CARE   Drink 1 cup (8 ounces) of fluid each time you have watery poop.  Do not drink the following fluids:  Those that contain simple sugars (fructose, glucose, galactose, lactose, sucrose, maltose).  Sports drinks.  Fruit juices.  Whole milk products.  Sodas.  Drinks with caffeine (coffee, tea, soda) or alcohol.  Oral rehydration solution may be used if the doctor says it is okay. You may make your own solution. Follow this recipe:   - teaspoon table salt.   teaspoon baking soda.   teaspoon salt substitute containing potassium chloride.  1 tablespoons sugar.  1 liter (34 ounces) of water.  Avoid the following foods:  High fiber foods, such as raw fruits and vegetables.  Nuts, seeds, and whole grain breads and cereals.   Those that are sweetened with sugar alcohols (xylitol, sorbitol, mannitol).  Try eating the following foods:  Starchy foods, such as rice, toast, pasta, low-sugar cereal, oatmeal, baked potatoes, crackers, and bagels.  Bananas.  Applesauce.  Eat probiotic-rich foods, such as yogurt and milk products that are fermented.  Wash your hands well after each time you have watery poop.  Only take medicine as told by your doctor.  Take a warm bath to help lessen  burning or pain from having watery poop. GET HELP RIGHT AWAY IF:   You cannot drink fluids without throwing up (vomiting).  You keep throwing up.  You have blood in your poop, or your poop looks black and tarry.  You do not pee (urinate) in 6-8 hours, or there is only a small amount of very dark pee.  You have belly (abdominal) pain that gets worse or stays in the same spot (localizes).  You are weak, dizzy, confused, or light-headed.  You have a very bad headache.  Your watery poop gets worse or does not get better.  You have a fever or lasting symptoms for more than 2-3 days.  You have a fever and your symptoms suddenly get worse. MAKE SURE YOU:   Understand these instructions.  Will watch your condition.  Will get help right away if you are not doing well or get worse. Document Released: 11/11/2007 Document Revised: 10/09/2013 Document Reviewed: 01/31/2012 [facility] Patient Information 2015 [place]. This information is not intended to replace advice given to you by your health care provider. Make sure you discuss any questions you have with your health care provider.

## 2014-05-06 ENCOUNTER — Other Ambulatory Visit: Payer: Self-pay | Admitting: Physician Assistant
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
## 2014-05-09 ENCOUNTER — Ambulatory Visit (INDEPENDENT_AMBULATORY_CARE_PROVIDER_SITE_OTHER): Payer: 59 | Admitting: Physician Assistant

## 2014-05-09 ENCOUNTER — Encounter: Payer: Self-pay | Admitting: Physician Assistant

## 2014-05-09 ENCOUNTER — Ambulatory Visit (INDEPENDENT_AMBULATORY_CARE_PROVIDER_SITE_OTHER): Payer: 59

## 2014-05-09 VITALS — BP 122/73 | HR 89 | Ht 60.0 in | Wt 170.0 lb

## 2014-05-09 DIAGNOSIS — K828 Other specified diseases of gallbladder: Secondary | ICD-10-CM

## 2014-05-09 DIAGNOSIS — K760 Fatty (change of) liver, not elsewhere classified: Secondary | ICD-10-CM

## 2014-05-09 DIAGNOSIS — R1011 Right upper quadrant pain: Secondary | ICD-10-CM

## 2014-05-09 DIAGNOSIS — R112 Nausea with vomiting, unspecified: Secondary | ICD-10-CM

## 2014-05-09 DIAGNOSIS — K219 Gastro-esophageal reflux disease without esophagitis: Secondary | ICD-10-CM

## 2014-05-09 DIAGNOSIS — Z308 Encounter for other contraceptive management: Secondary | ICD-10-CM

## 2014-05-09 MED ORDER — PANTOPRAZOLE SODIUM 40 MG PO TBEC: 40.0000 mg | DELAYED_RELEASE_TABLET | Freq: Every day | ORAL | Status: DC

## 2014-05-09 MED ORDER — MEDROXYPROGESTERONE ACETATE 150 MG/ML IM SUSP
150.0000 mg | Freq: Once | INTRAMUSCULAR | Status: AC
Start: 2014-05-09 — End: 2014-05-09
  Administered 2014-05-09: 150 mg via INTRAMUSCULAR

## 2014-05-11 ENCOUNTER — Telehealth: Payer: Self-pay | Admitting: *Deleted

## 2014-05-11 ENCOUNTER — Other Ambulatory Visit: Payer: Self-pay | Admitting: Physician Assistant

## 2014-05-11 DIAGNOSIS — R112 Nausea with vomiting, unspecified: Secondary | ICD-10-CM | POA: Insufficient documentation

## 2014-05-11 DIAGNOSIS — R1011 Right upper quadrant pain: Secondary | ICD-10-CM | POA: Insufficient documentation

## 2014-05-11 DIAGNOSIS — K828 Other specified diseases of gallbladder: Secondary | ICD-10-CM | POA: Insufficient documentation

## 2014-05-11 MED ORDER — SUCRALFATE 1 G PO TABS: 1.0000 g | ORAL_TABLET | Freq: Four times a day (QID) | ORAL | Status: DC

## 2014-05-11 MED ORDER — TRAMADOL HCL 50 MG PO TABS: 50.0000 mg | ORAL_TABLET | Freq: Three times a day (TID) | ORAL | Status: DC | PRN

## 2014-05-11 NOTE — Telephone Encounter (Signed)
Labs ordered.

## 2014-05-11 NOTE — Telephone Encounter (Signed)
Pt left vm this morning wanting you to know that she is still experiencing pain and now also has pain when she bends over or when she stands on her tippy toes to reach for something as well as pain in her right leg.

## 2014-05-11 NOTE — Telephone Encounter (Signed)
See result note.  

## 2014-05-11 NOTE — Progress Notes (Signed)
   Subjective:    Patient ID: Rhonda Tate, female    DOB: 05-31-1986, 28 y.o.   MRN: [id number]  HPI Pt presents to the clinic for follow up ED on 05/02/2014. She went in with diarrhea, nausea, abdominal pain and vomiting. It was thought that trazodone could be causing so she stopped it. She was also given immodium and emetrol for nausea. Diarrhea has resolved but abdominal pain and nausea have not. She continues to have pain every times she eats. She mentions she had gallbladder sludge in 2012 and they wanted to take out gallbladder. She wonders if not acting up again. No blood or black tarry stools.    Review of Systems  All other systems reviewed and are negative.      Objective:   Physical Exam  Constitutional: She is oriented to person, place, and time. She appears well-developed and well-nourished.  HENT:  Head: Normocephalic and atraumatic.  Cardiovascular: Normal rate, regular rhythm and normal heart sounds.   Pulmonary/Chest: Effort normal and breath sounds normal.  No CVA tenderness.   Abdominal: She exhibits no distension.  Diffusely tender over entire abdomen seemed to be worse over RUQ. No rebound or guarding.   Neurological: She is alert and oriented to person, place, and time.  Psychiatric: She has a normal mood and affect. Her behavior is normal.          Assessment & Plan:  Abdominal pain/nausea/vomiting/pain aggravated by food- certainly gallbladder sludge on exam of 2012 is suspious for gallbladder etiology. She is obese. Will get repeat gallbladder u/S. Cannot rule out GERD/PUD. Will switch to protonix 40mg  daily. For next couple of days can take BID. Will follow up accordingly.    Depo shot given today without complication. She is out of window but she is currently not sexually active.

## 2014-05-12 LAB — CBC WITH DIFFERENTIAL/PLATELET
BASOS ABS: 0 10*3/uL (ref 0.0–0.1)
Basophils Relative: 0 % (ref 0–1)
EOS ABS: 0 10*3/uL (ref 0.0–0.7)
Eosinophils Relative: 0 % (ref 0–5)
HEMATOCRIT: 38.6 % (ref 36.0–46.0)
Hemoglobin: 13.2 g/dL (ref 12.0–15.0)
Lymphocytes Relative: 37 % (ref 12–46)
Lymphs Abs: 2.1 10*3/uL (ref 0.7–4.0)
MCH: 28.9 pg (ref 26.0–34.0)
MCHC: 34.2 g/dL (ref 30.0–36.0)
MCV: 84.5 fL (ref 78.0–100.0)
MONOS PCT: 8 % (ref 3–12)
MPV: 11.4 fL (ref 9.4–12.4)
Monocytes Absolute: 0.4 10*3/uL (ref 0.1–1.0)
NEUTROS ABS: 3.1 10*3/uL (ref 1.7–7.7)
Neutrophils Relative %: 55 % (ref 43–77)
PLATELETS: 313 10*3/uL (ref 150–400)
RBC: 4.57 MIL/uL (ref 3.87–5.11)
RDW: 13.3 % (ref 11.5–15.5)
WBC: 5.6 10*3/uL (ref 4.0–10.5)

## 2014-05-14 ENCOUNTER — Other Ambulatory Visit: Payer: Self-pay | Admitting: Physician Assistant

## 2014-05-14 DIAGNOSIS — R112 Nausea with vomiting, unspecified: Secondary | ICD-10-CM

## 2014-05-14 DIAGNOSIS — K828 Other specified diseases of gallbladder: Secondary | ICD-10-CM

## 2014-05-14 DIAGNOSIS — R1011 Right upper quadrant pain: Secondary | ICD-10-CM

## 2014-05-14 LAB — H. PYLORI ANTIBODY, IGG

## 2014-05-17 ENCOUNTER — Ambulatory Visit (HOSPITAL_COMMUNITY): Payer: Self-pay | Admitting: Physician Assistant

## 2014-05-29 ENCOUNTER — Other Ambulatory Visit (INDEPENDENT_AMBULATORY_CARE_PROVIDER_SITE_OTHER): Payer: Self-pay | Admitting: General Surgery

## 2014-05-29 DIAGNOSIS — R1011 Right upper quadrant pain: Secondary | ICD-10-CM

## 2014-05-31 ENCOUNTER — Ambulatory Visit (HOSPITAL_COMMUNITY)
Admission: RE | Admit: 2014-05-31 | Discharge: 2014-05-31 | Disposition: A | Discharge status: Home / Self Care | Payer: 59 | Source: Ambulatory Visit | Attending: General Surgery | Admitting: General Surgery

## 2014-05-31 DIAGNOSIS — R1011 Right upper quadrant pain: Secondary | ICD-10-CM | POA: Insufficient documentation

## 2014-05-31 DIAGNOSIS — R112 Nausea with vomiting, unspecified: Secondary | ICD-10-CM | POA: Insufficient documentation

## 2014-05-31 MED ORDER — SINCALIDE 5 MCG IJ SOLR
0.0200 ug/kg | Freq: Once | INTRAMUSCULAR | Status: AC
  Administered 2014-05-31: 3.98 ug via INTRAVENOUS

## 2014-05-31 MED ORDER — TECHNETIUM TC 99M MEBROFENIN IV KIT
5.0000 | PACK | Freq: Once | INTRAVENOUS | Status: AC | PRN
  Administered 2014-05-31: 5 via INTRAVENOUS

## 2014-05-31 MED ORDER — SINCALIDE 5 MCG IJ SOLR
INTRAMUSCULAR | Status: AC
  Filled 2014-05-31: qty 5

## 2014-06-06 ENCOUNTER — Ambulatory Visit (INDEPENDENT_AMBULATORY_CARE_PROVIDER_SITE_OTHER): Payer: 59 | Admitting: Physician Assistant

## 2014-06-06 ENCOUNTER — Other Ambulatory Visit: Payer: Self-pay | Admitting: Physician Assistant

## 2014-06-06 ENCOUNTER — Encounter: Payer: Self-pay | Admitting: Physician Assistant

## 2014-06-06 VITALS — BP 123/79 | HR 95 | Temp 98.0°F | Ht 60.0 in

## 2014-06-06 DIAGNOSIS — J0100 Acute maxillary sinusitis, unspecified: Secondary | ICD-10-CM

## 2014-06-06 DIAGNOSIS — J040 Acute laryngitis: Secondary | ICD-10-CM

## 2014-06-06 DIAGNOSIS — N898 Other specified noninflammatory disorders of vagina: Secondary | ICD-10-CM

## 2014-06-06 MED ORDER — DOXYCYCLINE HYCLATE 100 MG PO TABS: 100.0000 mg | ORAL_TABLET | Freq: Two times a day (BID) | ORAL | Status: DC

## 2014-06-06 NOTE — Progress Notes (Deleted)
   Subjective:    Patient ID: Rhonda Tate, female    DOB: 04/25/1986, 28 y.o.   MRN: [id number]  HPI Pt presents to the clinic with 1    Review of Systems     Objective:   Physical Exam        Assessment & Plan:

## 2014-06-10 NOTE — Progress Notes (Signed)
   Subjective:    Patient ID: Rhonda Tate, female    DOB: 22-Oct-1985, 29 y.o.   MRN: [id number]  HPI Pt presents to the clinic with sore throat and accompany upper respiratory symptoms that started 06/03/2014. They have progressed into a headache, sinus pressure, ear pain, congested, cough that is slightly productive, and wheezing at night. She does not have any appetite and has a low-grade fever of 99 at night. She is taking Mucinex sinus max, vitamin C and cough drops with little relief. She is gradually started to lose her voice. She also does not notice this is associated but she has developed a small sore in her vaginal area. She does shave in her pubic area but has not had an infection before she wonders if these are linked. She is not currently sexually active but has been in the past. She has never had any evidence of herpes.   Review of Systems  All other systems reviewed and are negative.      Objective:   Physical Exam  Constitutional: She is oriented to person, place, and time. She appears well-developed and well-nourished.  Genitourinary:     Neurological: She is alert and oriented to person, place, and time.  Skin: Skin is dry.  Psychiatric: She has a normal mood and affect. Her behavior is normal.          Assessment & Plan:  Acute maxillary sinusitis/laryngitis-treated with doxycycline for 10 days. Discuss other symptomatic care. She can certainly use Delsym for cough. Handout given. Encouraged patient to rest voice to help laryngitis improved. Follow-up as needed.   Vaginal lesion-vaginal lesion does appear to be possibly a folliculitis. Antibiotic doxycycline was given for sinus infection will also treat any skin infection. Advised patient to use warm compresses. Since vaginal lesion was leaking some discharged we did get a viral culture to confirm no herpes. Will let patient with results.

## 2014-06-13 ENCOUNTER — Other Ambulatory Visit (INDEPENDENT_AMBULATORY_CARE_PROVIDER_SITE_OTHER): Payer: Self-pay | Admitting: Surgery

## 2014-06-13 ENCOUNTER — Other Ambulatory Visit (INDEPENDENT_AMBULATORY_CARE_PROVIDER_SITE_OTHER): Payer: Self-pay | Admitting: General Surgery

## 2014-06-13 ENCOUNTER — Other Ambulatory Visit (INDEPENDENT_AMBULATORY_CARE_PROVIDER_SITE_OTHER): Payer: Self-pay

## 2014-06-15 ENCOUNTER — Telehealth: Payer: Self-pay | Admitting: Family Medicine

## 2014-06-15 LAB — REFLEX ADENOVIRUS CULTURE

## 2014-06-15 LAB — RFX HSV/VARICELLA ZOSTER RAPID CULT

## 2014-06-15 NOTE — Telephone Encounter (Signed)
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  Since this sample was from the skin I felt there was no need for treatment this far out from the outbreak.  Patient has NOT been notified yet, will forward to PCP for further management.

## 2014-06-16 LAB — CYTOMEGALOVIRUS CULTURE

## 2014-06-16 LAB — VIRAL CULTURE VIRC: Source: 33013500006750

## 2014-06-17 ENCOUNTER — Other Ambulatory Visit (INDEPENDENT_AMBULATORY_CARE_PROVIDER_SITE_OTHER): Payer: Self-pay | Admitting: General Surgery

## 2014-06-18 ENCOUNTER — Encounter: Payer: Self-pay | Admitting: Physician Assistant

## 2014-06-18 DIAGNOSIS — B009 Herpesviral infection, unspecified: Secondary | ICD-10-CM | POA: Insufficient documentation

## 2014-06-18 NOTE — Telephone Encounter (Signed)
Pt should be contacted via today by nurse with results.

## 2014-06-27 ENCOUNTER — Other Ambulatory Visit: Payer: Self-pay | Admitting: Physician Assistant

## 2014-07-05 ENCOUNTER — Inpatient Hospital Stay (HOSPITAL_COMMUNITY): Admission: RE | Admit: 2014-07-05 | Payer: Self-pay | Source: Ambulatory Visit

## 2014-07-05 ENCOUNTER — Encounter (HOSPITAL_COMMUNITY): Payer: Self-pay

## 2014-07-05 ENCOUNTER — Other Ambulatory Visit: Payer: Self-pay

## 2014-07-05 ENCOUNTER — Encounter (HOSPITAL_COMMUNITY)
Admission: RE | Admit: 2014-07-05 | Discharge: 2014-07-05 | Disposition: A | Discharge status: Home / Self Care | Payer: 59 | Source: Ambulatory Visit | Attending: General Surgery | Admitting: General Surgery

## 2014-07-05 DIAGNOSIS — R000 Tachycardia, unspecified: Secondary | ICD-10-CM | POA: Insufficient documentation

## 2014-07-05 DIAGNOSIS — K219 Gastro-esophageal reflux disease without esophagitis: Secondary | ICD-10-CM | POA: Insufficient documentation

## 2014-07-05 DIAGNOSIS — Z01818 Encounter for other preprocedural examination: Secondary | ICD-10-CM | POA: Insufficient documentation

## 2014-07-05 DIAGNOSIS — Z01810 Encounter for preprocedural cardiovascular examination: Secondary | ICD-10-CM | POA: Insufficient documentation

## 2014-07-05 DIAGNOSIS — Z01812 Encounter for preprocedural laboratory examination: Secondary | ICD-10-CM | POA: Diagnosis not present

## 2014-07-05 HISTORY — DX: Diarrhea, unspecified: R19.7

## 2014-07-05 HISTORY — DX: Tachycardia, unspecified: R00.0

## 2014-07-05 HISTORY — DX: Headache, unspecified: R51.9

## 2014-07-05 HISTORY — DX: Constipation, unspecified: K59.00

## 2014-07-05 HISTORY — DX: Headache: R51

## 2014-07-05 LAB — COMPREHENSIVE METABOLIC PANEL
ALK PHOS: 71 U/L (ref 39–117)
ALT: 25 U/L (ref 0–35)
AST: 24 U/L (ref 0–37)
Albumin: 4.3 g/dL (ref 3.5–5.2)
Anion gap: 11 (ref 5–15)
BILIRUBIN TOTAL: 0.3 mg/dL (ref 0.3–1.2)
BUN: 6 mg/dL (ref 6–23)
CO2: 24 mmol/L (ref 19–32)
CREATININE: 0.78 mg/dL (ref 0.50–1.10)
Calcium: 9.5 mg/dL (ref 8.4–10.5)
Chloride: 105 mmol/L (ref 96–112)
GFR calc non Af Amer: >90 mL/min (ref >90)
Glucose, Bld: 90 mg/dL (ref 70–99)
Potassium: 3.8 mmol/L (ref 3.5–5.1)
Sodium: 140 mmol/L (ref 135–145)
Total Protein: 7.6 g/dL (ref 6.0–8.3)

## 2014-07-05 LAB — CBC WITH DIFFERENTIAL/PLATELET
Basophils Absolute: 0 10*3/uL (ref 0.0–0.1)
Basophils Relative: 0 % (ref 0–1)
EOS PCT: 0 % (ref 0–5)
Eosinophils Absolute: 0 10*3/uL (ref 0.0–0.7)
HCT: 39.3 % (ref 36.0–46.0)
HEMOGLOBIN: 13.1 g/dL (ref 12.0–15.0)
LYMPHS ABS: 2.2 10*3/uL (ref 0.7–4.0)
Lymphocytes Relative: 34 % (ref 12–46)
MCH: 28.7 pg (ref 26.0–34.0)
MCHC: 33.3 g/dL (ref 30.0–36.0)
MCV: 86 fL (ref 78.0–100.0)
MONO ABS: 0.5 10*3/uL (ref 0.1–1.0)
Monocytes Relative: 8 % (ref 3–12)
Neutro Abs: 3.7 10*3/uL (ref 1.7–7.7)
Neutrophils Relative %: 58 % (ref 43–77)
Platelets: 303 10*3/uL (ref 150–400)
RBC: 4.57 MIL/uL (ref 3.87–5.11)
RDW: 13.1 % (ref 11.5–15.5)
WBC: 6.4 10*3/uL (ref 4.0–10.5)

## 2014-07-05 LAB — HCG, SERUM, QUALITATIVE: Preg, Serum: NEGATIVE

## 2014-07-05 NOTE — Pre-Procedure Instructions (Signed)
Servando SnareJessica E Leopard  07/05/2014   Your procedure is scheduled on:  Thursday July 12, 2014 at 7:30 AM.  [facility] at 5:30 AM.  Call this number if you have problems the morning of surgery: [phone]  For any other questions Monday-Friday from 8am-4pm call: [phone]  Remember:   Do not eat food or drink liquids after midnight.   Take these medicines the morning of surgery with A SIP OF WATER: Acetaminophen (Tylenol) if needed, Cetirizine (Zyrtec), Omeprazole (Prilosec) and Venlafaxine (Effexor XR)   Stop taking any aspirin, herbal medications, Alleve, Motrin, Advil, Ibuprofen, etc 5 days before surgery   Do not wear jewelry, make-up or nail polish.  Do not wear lotions, powders, or perfumes.   Do not shave 48 hours prior to surgery.   Do not bring valuables to the hospital.  [facility] is not responsible for any belongings or valuables.               Contacts, dentures or bridgework may not be worn into surgery.  Leave suitcase in the car. After surgery it may be brought to your room.  For patients admitted to the hospital, discharge time is determined by your treatment team.               Patients discharged the day of surgery will not be allowed to drive home.  Name and phone number of your driver:   Special Instructions: Shower using CHG soap the night before and the morning of your surgery   Please read over the following fact sheets that you were given: Pain Booklet, Coughing and Deep Breathing and Surgical Site Infection Prevention

## 2014-07-05 NOTE — Progress Notes (Signed)
Patient denied having any chest pain, discomfort or shortness of breath, but informed Nurse that per "Fitbit" since November 2015 she has been having a fast heart rate, but think it is related to her gallbladder. Patient stated on January 18th her heart rate was 178, and on January 24th it was 131. Patient stated she informed her PCP of this but her PCP did not do anything about it, she also informed Nurse that she has seen a small amount of blood in her stool which she also informed her PCP about and her PCP did not choose to do anything about it, therefore patient is currently looking for a new PCP.    Patient stated she was supposed to have her gallbladder removed in 2012 however [facility] and she had a colon infection therefore her gallbladder could not be removed.

## 2014-07-06 NOTE — Progress Notes (Signed)
Anesthesia Chart Review:  Patient is a [age] year old female scheduled for laparoscopic cholecystectomy on 07/12/14 by Dr. Carolynne Edouardoth.  History includes tachycardia (reported "fast heart rate" readings on Fitbit since 04/2014; up to 178 on 06/25/14 and 131 on 07/01/14),  Bipolar disorder, fatty liver, heartburn, iron deficiency anemia, anxiety, depression, GERD, headaches, wisdom tooth extraction. BMI is 31.7. PCP is listed as Tandy GawJade Breeback, PA-C [facility]   Meds include Zyrtec, Depo-Provera, Prilosec, Effexor-XR.  07/05/14 EKG: SR with short PR, non-specific T wave abnormality. (EKG is not yet confirmed by cardiology.  I am measuring PR interval around 120 ms, not 104 ms. She had a comparison [facility] that measured her PR interval then at 114ms. I don't seen any distinct Delta waves on either EKG.)  Preoperative labs noted.   Reviewed above with anesthesiologist Dr. Michelle Piperssey.  If patient asymptomatic then EKG findings should not prevent her from proceeding with surgery. Could consider an out-patient Holter if she continues to experience periods of tachycardia.  I called patient.  She reports she is completely asymptomatic when her HR is elevated.  Higher elevations only last a few seconds.  No chest pain, SOB, syncope, dizziness. HR seems to be faster when her gall bladder is acting up. I did advise on-going communication with her PCP regarding her periodic tachycardia. I'm not sure how accurate HR measurements are in all situations with Fitbit monitoring.  Of note, she did report waking up doing her colonoscopy/endoscopy, so is concerned about this with upcoming surgery.  I advised the endo procedures are typically done under [facility], and her cholecystectomy would be done under [facility].  She will also talk with her anesthesiologist on the day of surgery.  I will route a cope of my note to her PCP.  Velna Ochsllison Cohen Boettner, PA-C [facility]Anesthesiology Phone [phone]  [phone] 07/06/2014 4:46 PM

## 2014-07-09 HISTORY — PX: CHOLECYSTECTOMY, LAPAROSCOPIC: SHX56

## 2014-07-11 MED ORDER — CEFAZOLIN SODIUM-DEXTROSE 2-3 GM-% IV SOLR
2.0000 g | INTRAVENOUS | Status: AC
  Administered 2014-07-12: 2 g via INTRAVENOUS
  Filled 2014-07-11: qty 50

## 2014-07-11 NOTE — Anesthesia Preprocedure Evaluation (Addendum)
Anesthesia Evaluation  Patient identified by MRN, date of birth, ID band Patient awake    Reviewed: Allergy & Precautions, NPO status , Patient's Chart, lab work & pertinent test results, reviewed documented beta blocker date and time   Airway Mallampati: II   Neck ROM: Full    Dental  (+) Teeth Intact   Pulmonary  breath sounds clear to auscultation        Cardiovascular negative cardio ROS  Rhythm:Regular     Neuro/Psych Anxiety Depression Bipolar Disorder    GI/Hepatic Neg liver ROS, GERD-  Medicated,  Endo/Other  negative endocrine ROS  Renal/GU negative Renal ROS     Musculoskeletal   Abdominal (+) + obese,   Peds  Hematology 13/39 H/H   Anesthesia Other Findings   Reproductive/Obstetrics                            Anesthesia Physical Anesthesia Plan  ASA: II  Anesthesia Plan: General   Post-op Pain Management:    Induction: Intravenous  Airway Management Planned: Oral ETT  Additional Equipment:   Intra-op Plan:   Post-operative Plan: Extubation in OR  Informed Consent: I have reviewed the patients History and Physical, chart, labs and discussed the procedure including the risks, benefits and alternatives for the proposed anesthesia with the patient or authorized representative who has indicated his/her understanding and acceptance.     Plan Discussed with:   Anesthesia Plan Comments:        Anesthesia Quick Evaluation

## 2014-07-12 ENCOUNTER — Encounter (HOSPITAL_COMMUNITY)
Admission: RE | Disposition: A | Discharge status: Home / Self Care | Payer: Self-pay | Source: Ambulatory Visit | Attending: General Surgery

## 2014-07-12 ENCOUNTER — Ambulatory Visit (HOSPITAL_COMMUNITY): Payer: 59 | Admitting: Vascular Surgery

## 2014-07-12 ENCOUNTER — Ambulatory Visit (HOSPITAL_COMMUNITY)
Admission: RE | Admit: 2014-07-12 | Discharge: 2014-07-12 | Disposition: A | Discharge status: Home / Self Care | Payer: 59 | Source: Ambulatory Visit | Attending: General Surgery | Admitting: General Surgery

## 2014-07-12 ENCOUNTER — Encounter (HOSPITAL_COMMUNITY): Payer: Self-pay | Admitting: *Deleted

## 2014-07-12 ENCOUNTER — Ambulatory Visit (HOSPITAL_COMMUNITY): Payer: 59 | Admitting: Anesthesiology

## 2014-07-12 ENCOUNTER — Ambulatory Visit (HOSPITAL_COMMUNITY): Payer: 59

## 2014-07-12 DIAGNOSIS — F329 Major depressive disorder, single episode, unspecified: Secondary | ICD-10-CM | POA: Diagnosis not present

## 2014-07-12 DIAGNOSIS — K8050 Calculus of bile duct without cholangitis or cholecystitis without obstruction: Secondary | ICD-10-CM | POA: Diagnosis present

## 2014-07-12 DIAGNOSIS — K8020 Calculus of gallbladder without cholecystitis without obstruction: Secondary | ICD-10-CM

## 2014-07-12 DIAGNOSIS — Z79899 Other long term (current) drug therapy: Secondary | ICD-10-CM | POA: Diagnosis not present

## 2014-07-12 DIAGNOSIS — K219 Gastro-esophageal reflux disease without esophagitis: Secondary | ICD-10-CM | POA: Diagnosis not present

## 2014-07-12 DIAGNOSIS — F419 Anxiety disorder, unspecified: Secondary | ICD-10-CM | POA: Diagnosis not present

## 2014-07-12 DIAGNOSIS — Z91041 Radiographic dye allergy status: Secondary | ICD-10-CM | POA: Insufficient documentation

## 2014-07-12 DIAGNOSIS — G43909 Migraine, unspecified, not intractable, without status migrainosus: Secondary | ICD-10-CM | POA: Insufficient documentation

## 2014-07-12 HISTORY — PX: CHOLECYSTECTOMY: SHX55

## 2014-07-12 SURGERY — LAPAROSCOPIC CHOLECYSTECTOMY WITH INTRAOPERATIVE CHOLANGIOGRAM
Anesthesia: General | Site: Abdomen

## 2014-07-12 MED ORDER — PROPOFOL 10 MG/ML IV BOLUS
INTRAVENOUS | Status: AC
  Filled 2014-07-12: qty 20

## 2014-07-12 MED ORDER — BUPIVACAINE-EPINEPHRINE (PF) 0.25% -1:200000 IJ SOLN
INTRAMUSCULAR | Status: AC
  Filled 2014-07-12: qty 30

## 2014-07-12 MED ORDER — HYDROMORPHONE HCL 1 MG/ML IJ SOLN
INTRAMUSCULAR | Status: AC
  Filled 2014-07-12: qty 1

## 2014-07-12 MED ORDER — GLYCOPYRROLATE 0.2 MG/ML IJ SOLN
INTRAMUSCULAR | Status: DC | PRN
  Administered 2014-07-12: 0.6 mg via INTRAVENOUS

## 2014-07-12 MED ORDER — OXYCODONE-ACETAMINOPHEN 5-325 MG PO TABS: 1.0000 | ORAL_TABLET | ORAL | Status: DC | PRN

## 2014-07-12 MED ORDER — LIDOCAINE HCL (CARDIAC) 20 MG/ML IV SOLN
INTRAVENOUS | Status: AC
  Filled 2014-07-12: qty 5

## 2014-07-12 MED ORDER — SODIUM CHLORIDE 0.9 % IJ SOLN
INTRAMUSCULAR | Status: AC
  Filled 2014-07-12: qty 20

## 2014-07-12 MED ORDER — PHENYLEPHRINE 40 MCG/ML (10ML) SYRINGE FOR IV PUSH (FOR BLOOD PRESSURE SUPPORT)
PREFILLED_SYRINGE | INTRAVENOUS | Status: AC
Start: 2014-07-12 — End: 2014-07-12
  Filled 2014-07-12: qty 20

## 2014-07-12 MED ORDER — FENTANYL CITRATE 0.05 MG/ML IJ SOLN
INTRAMUSCULAR | Status: AC
  Filled 2014-07-12: qty 5

## 2014-07-12 MED ORDER — SUCCINYLCHOLINE CHLORIDE 20 MG/ML IJ SOLN
INTRAMUSCULAR | Status: AC
  Filled 2014-07-12: qty 1

## 2014-07-12 MED ORDER — LABETALOL HCL 5 MG/ML IV SOLN
INTRAVENOUS | Status: DC | PRN
  Administered 2014-07-12: 10 mg via INTRAVENOUS

## 2014-07-12 MED ORDER — ONDANSETRON HCL 4 MG/2ML IJ SOLN
INTRAMUSCULAR | Status: AC
  Filled 2014-07-12: qty 2

## 2014-07-12 MED ORDER — EPHEDRINE SULFATE 50 MG/ML IJ SOLN
INTRAMUSCULAR | Status: AC
  Filled 2014-07-12: qty 1

## 2014-07-12 MED ORDER — SODIUM CHLORIDE 0.9 % IR SOLN
Status: DC | PRN
  Administered 2014-07-12: 1000 mL

## 2014-07-12 MED ORDER — SODIUM CHLORIDE 0.9 % IV SOLN
INTRAVENOUS | Status: DC | PRN
  Administered 2014-07-12: 08:00:00

## 2014-07-12 MED ORDER — MIDAZOLAM HCL 5 MG/5ML IJ SOLN
INTRAMUSCULAR | Status: DC | PRN
  Administered 2014-07-12: 2 mg via INTRAVENOUS

## 2014-07-12 MED ORDER — LIDOCAINE HCL (CARDIAC) 20 MG/ML IV SOLN
INTRAVENOUS | Status: DC | PRN
  Administered 2014-07-12: 100 mg via INTRAVENOUS

## 2014-07-12 MED ORDER — PROMETHAZINE HCL 25 MG/ML IJ SOLN: 6.2500 mg | INTRAMUSCULAR | Status: DC | PRN

## 2014-07-12 MED ORDER — MIDAZOLAM HCL 2 MG/2ML IJ SOLN
INTRAMUSCULAR | Status: AC
  Filled 2014-07-12: qty 2

## 2014-07-12 MED ORDER — DEXAMETHASONE SODIUM PHOSPHATE 10 MG/ML IJ SOLN
INTRAMUSCULAR | Status: DC | PRN
  Administered 2014-07-12: 10 mg via INTRAVENOUS

## 2014-07-12 MED ORDER — EPHEDRINE SULFATE 50 MG/ML IJ SOLN
INTRAMUSCULAR | Status: AC
Start: 2014-07-12 — End: 2014-07-12
  Filled 2014-07-12: qty 2

## 2014-07-12 MED ORDER — FENTANYL CITRATE 0.05 MG/ML IJ SOLN
25.0000 ug | INTRAMUSCULAR | Status: DC | PRN
  Administered 2014-07-12 (×2): 50 ug via INTRAVENOUS

## 2014-07-12 MED ORDER — PHENYLEPHRINE 40 MCG/ML (10ML) SYRINGE FOR IV PUSH (FOR BLOOD PRESSURE SUPPORT)
PREFILLED_SYRINGE | INTRAVENOUS | Status: AC
  Filled 2014-07-12: qty 10

## 2014-07-12 MED ORDER — GLYCOPYRROLATE 0.2 MG/ML IJ SOLN
INTRAMUSCULAR | Status: AC
  Filled 2014-07-12: qty 1

## 2014-07-12 MED ORDER — DIPHENHYDRAMINE HCL 50 MG/ML IJ SOLN
INTRAMUSCULAR | Status: DC | PRN
  Administered 2014-07-12: 25 mg via INTRAVENOUS

## 2014-07-12 MED ORDER — PROPOFOL 10 MG/ML IV BOLUS
INTRAVENOUS | Status: DC | PRN
  Administered 2014-07-12: 150 mg via INTRAVENOUS

## 2014-07-12 MED ORDER — GLYCOPYRROLATE 0.2 MG/ML IJ SOLN
INTRAMUSCULAR | Status: AC
  Filled 2014-07-12: qty 2

## 2014-07-12 MED ORDER — FENTANYL CITRATE 0.05 MG/ML IJ SOLN
INTRAMUSCULAR | Status: AC
  Filled 2014-07-12: qty 2

## 2014-07-12 MED ORDER — ROCURONIUM BROMIDE 50 MG/5ML IV SOLN
INTRAVENOUS | Status: AC
  Filled 2014-07-12: qty 2

## 2014-07-12 MED ORDER — LACTATED RINGERS IV SOLN
INTRAVENOUS | Status: DC | PRN
  Administered 2014-07-12 (×2): via INTRAVENOUS

## 2014-07-12 MED ORDER — LIDOCAINE HCL (CARDIAC) 20 MG/ML IV SOLN
INTRAVENOUS | Status: AC
  Filled 2014-07-12: qty 10

## 2014-07-12 MED ORDER — NEOSTIGMINE METHYLSULFATE 10 MG/10ML IV SOLN
INTRAVENOUS | Status: AC
  Filled 2014-07-12: qty 1

## 2014-07-12 MED ORDER — ONDANSETRON HCL 4 MG/2ML IJ SOLN
INTRAMUSCULAR | Status: AC
  Filled 2014-07-12: qty 4

## 2014-07-12 MED ORDER — SODIUM CHLORIDE 0.9 % IJ SOLN
INTRAMUSCULAR | Status: AC
  Filled 2014-07-12: qty 10

## 2014-07-12 MED ORDER — FENTANYL CITRATE 0.05 MG/ML IJ SOLN
INTRAMUSCULAR | Status: DC | PRN
  Administered 2014-07-12 (×2): 100 ug via INTRAVENOUS
  Administered 2014-07-12: 50 ug via INTRAVENOUS
  Administered 2014-07-12 (×2): 100 ug via INTRAVENOUS
  Administered 2014-07-12: 50 ug via INTRAVENOUS

## 2014-07-12 MED ORDER — ROCURONIUM BROMIDE 100 MG/10ML IV SOLN
INTRAVENOUS | Status: DC | PRN
  Administered 2014-07-12: 40 mg via INTRAVENOUS

## 2014-07-12 MED ORDER — 0.9 % SODIUM CHLORIDE (POUR BTL) OPTIME
TOPICAL | Status: DC | PRN
  Administered 2014-07-12: 1000 mL

## 2014-07-12 MED ORDER — BUPIVACAINE-EPINEPHRINE 0.25% -1:200000 IJ SOLN
INTRAMUSCULAR | Status: DC | PRN
  Administered 2014-07-12: 30 mL

## 2014-07-12 MED ORDER — MEPERIDINE HCL 25 MG/ML IJ SOLN: 6.2500 mg | INTRAMUSCULAR | Status: DC | PRN

## 2014-07-12 MED ORDER — NEOSTIGMINE METHYLSULFATE 10 MG/10ML IV SOLN
INTRAVENOUS | Status: DC | PRN
  Administered 2014-07-12: 4 mg via INTRAVENOUS

## 2014-07-12 MED ORDER — ONDANSETRON HCL 4 MG/2ML IJ SOLN
INTRAMUSCULAR | Status: DC | PRN
  Administered 2014-07-12: 4 mg via INTRAVENOUS

## 2014-07-12 MED ORDER — ROCURONIUM BROMIDE 50 MG/5ML IV SOLN
INTRAVENOUS | Status: AC
  Filled 2014-07-12: qty 1

## 2014-07-12 SURGICAL SUPPLY — 42 items
APPLIER CLIP 5 13 M/L LIGAMAX5 (MISCELLANEOUS) ×3
APR CLP MED LRG 5 ANG JAW (MISCELLANEOUS) ×1
BAG SPEC RTRVL LRG 6X4 10 (ENDOMECHANICALS) ×1
BLADE SURG ROTATE 9660 (MISCELLANEOUS) IMPLANT
CANISTER SUCTION 2500CC (MISCELLANEOUS) ×3 IMPLANT
CATH REDDICK CHOLANGI 4FR 50CM (CATHETERS) ×3 IMPLANT
CHLORAPREP W/TINT 26ML (MISCELLANEOUS) ×3 IMPLANT
CLIP APPLIE 5 13 M/L LIGAMAX5 (MISCELLANEOUS) ×1 IMPLANT
COVER MAYO STAND STRL (DRAPES) ×3 IMPLANT
COVER SURGICAL LIGHT HANDLE (MISCELLANEOUS) ×3 IMPLANT
DRAPE C-ARM 42X72 X-RAY (DRAPES) ×3 IMPLANT
DRAPE LAPAROSCOPIC ABDOMINAL (DRAPES) ×3 IMPLANT
ELECT REM PT RETURN 9FT ADLT (ELECTROSURGICAL) ×3
ELECTRODE REM PT RTRN 9FT ADLT (ELECTROSURGICAL) ×1 IMPLANT
GLOVE BIO SURGEON STRL SZ7 (GLOVE) ×2 IMPLANT
GLOVE BIO SURGEON STRL SZ7.5 (GLOVE) ×3 IMPLANT
GLOVE BIOGEL PI IND STRL 7.0 (GLOVE) IMPLANT
GLOVE BIOGEL PI IND STRL 7.5 (GLOVE) IMPLANT
GLOVE BIOGEL PI INDICATOR 7.0 (GLOVE) ×6
GLOVE BIOGEL PI INDICATOR 7.5 (GLOVE) ×2
GLOVE ECLIPSE 6.5 STRL STRAW (GLOVE) ×2 IMPLANT
GLOVE SURG SS PI 7.0 STRL IVOR (GLOVE) ×6 IMPLANT
GOWN STRL REUS W/ TWL LRG LVL3 (GOWN DISPOSABLE) ×3 IMPLANT
GOWN STRL REUS W/TWL LRG LVL3 (GOWN DISPOSABLE) ×12
IV CATH 14GX2 1/4 (CATHETERS) ×3 IMPLANT
KIT BASIN OR (CUSTOM PROCEDURE TRAY) ×3 IMPLANT
KIT ROOM TURNOVER OR (KITS) ×3 IMPLANT
LIQUID BAND (GAUZE/BANDAGES/DRESSINGS) ×3 IMPLANT
NS IRRIG 1000ML POUR BTL (IV SOLUTION) ×3 IMPLANT
PAD ARMBOARD 7.5X6 YLW CONV (MISCELLANEOUS) ×3 IMPLANT
POUCH SPECIMEN RETRIEVAL 10MM (ENDOMECHANICALS) ×3 IMPLANT
SCISSORS LAP 5X35 DISP (ENDOMECHANICALS) ×3 IMPLANT
SET IRRIG TUBING LAPAROSCOPIC (IRRIGATION / IRRIGATOR) ×3 IMPLANT
SLEEVE ENDOPATH XCEL 5M (ENDOMECHANICALS) ×6 IMPLANT
SPECIMEN JAR SMALL (MISCELLANEOUS) ×3 IMPLANT
SUT MNCRL AB 4-0 PS2 18 (SUTURE) ×3 IMPLANT
TOWEL OR 17X24 6PK STRL BLUE (TOWEL DISPOSABLE) ×3 IMPLANT
TOWEL OR 17X26 10 PK STRL BLUE (TOWEL DISPOSABLE) ×3 IMPLANT
TRAY LAPAROSCOPIC (CUSTOM PROCEDURE TRAY) ×3 IMPLANT
TROCAR XCEL BLUNT TIP 100MML (ENDOMECHANICALS) ×3 IMPLANT
TROCAR XCEL NON-BLD 5MMX100MML (ENDOMECHANICALS) ×3 IMPLANT
TUBING INSUFFLATION (TUBING) ×3 IMPLANT

## 2014-07-12 NOTE — Anesthesia Postprocedure Evaluation (Signed)
  Anesthesia Post-op Note  Patient: Servando SnareJessica E Perfect  Procedure(s) Performed: Procedure(s): LAPAROSCOPIC CHOLECYSTECTOMY WITH INTRAOPERATIVE CHOLANGIOGRAM (N/A)  Patient Location: PACU  Anesthesia Type:General  Level of Consciousness: awake and alert   Airway and Oxygen Therapy: Patient Spontanous Breathing  Post-op Pain: mild  Post-op Assessment: Post-op Vital signs reviewed, Patient's Cardiovascular Status Stable, Respiratory Function Stable, Patent Airway and No signs of Nausea or vomiting  Post-op Vital Signs: Reviewed and stable  Last Vitals:  Filed Vitals:   07/12/14 0915  BP:   Pulse: 61  Temp:   Resp: 14    Complications: No apparent anesthesia complications

## 2014-07-12 NOTE — Op Note (Signed)
07/12/2014  8:40 AM  PATIENT:  Rhonda Tate  29 y.o. female  PRE-OPERATIVE DIAGNOSIS:  Biliary Colic  POST-OPERATIVE DIAGNOSIS:  Biliary Colic  PROCEDURE:  Procedure(s): LAPAROSCOPIC CHOLECYSTECTOMY WITH INTRAOPERATIVE CHOLANGIOGRAM (N/A)  SURGEON:  Surgeon(s) and Role:    * Griselda Miner, MD - Primary    * Manus Rudd, MD - Assisting  PHYSICIAN ASSISTANT:   ASSISTANTS: Dr. Corliss Skains   ANESTHESIA:   general  EBL:  Total I/O In: 1000 [I.V.:1000] Out: -   BLOOD ADMINISTERED:none  DRAINS: none   LOCAL MEDICATIONS USED:  MARCAINE     SPECIMEN:  Source of Specimen:  gallbladder  DISPOSITION OF SPECIMEN:  PATHOLOGY  COUNTS:  YES  TOURNIQUET:  * No tourniquets in log *  DICTATION: .Dragon Dictation   Procedure: After informed consent was obtained the patient was brought to the operating room and placed in the supine position on the operating room table. After adequate induction of general anesthesia the patient's abdomen was prepped with ChloraPrep allowed to dry and draped in usual sterile manner. The area below the umbilicus was infiltrated with quarter percent  Marcaine. A small incision was made with a 15 blade knife. The incision was carried down through the subcutaneous tissue bluntly with a hemostat and [employer] retractors. The linea alba was identified. The linea alba was incised with a 15 blade knife and each side was grasped with Coker clamps. The preperitoneal space was then probed with a hemostat until the peritoneum was opened and access was gained to the abdominal cavity. A 0 Vicryl pursestring stitch was placed in the fascia surrounding the opening. A Hassan cannula was then placed through the opening and anchored in place with the previously placed Vicryl purse string stitch. The abdomen was insufflated with carbon dioxide without difficulty. A laparoscope was inserted through the [facility] cannula in the right upper quadrant was inspected. Next the epigastric  region was infiltrated with % Marcaine. A small incision was made with a 15 blade knife. A 5 mm port was placed bluntly through this incision into the abdominal cavity under direct vision. Next 2 sites were chosen laterally on the right side of the abdomen for placement of 5 mm ports. Each of these areas was infiltrated with quarter percent Marcaine. Small stab incisions were made with a 15 blade knife. 5 mm ports were then placed bluntly through these incisions into the abdominal cavity under direct vision without difficulty. A blunt grasper was placed through the lateralmost 5 mm port and used to grasp the dome of the gallbladder and elevated anteriorly and superiorly. Another blunt grasper was placed through the other 5 mm port and used to retract the body and neck of the gallbladder. A dissector was placed through the epigastric port and using the electrocautery the peritoneal reflection at the gallbladder neck was opened. Blunt dissection was then carried out in this area until the gallbladder neck-cystic duct junction was readily identified and a good window was created. A single clip was placed on the gallbladder neck. A small  ductotomy was made just below the clip with laparoscopic scissors. A 14-gauge Angiocath was then placed through the anterior abdominal wall under direct vision. A Reddick cholangiogram catheter was then placed through the Angiocath and flushed. The catheter was then placed in the cystic duct and anchored in place with a clip. A cholangiogram was obtained that showed no filling defects good emptying into the duodenum an adequate length on the cystic duct. The anchoring clip and catheters  were then removed from the patient. 3 clips were placed proximally on the cystic duct and the duct was divided between the 2 sets of clips. Posterior to this the cystic artery was identified and again dissected bluntly in a circumferential manner until a good window  was created. 2 clips were placed  proximally and one distally on the artery and the artery was divided between the 2 sets of clips. Next a laparoscopic hook cautery device was used to separate the gallbladder from the liver bed. Prior to completely detaching the gallbladder from the liver bed the liver bed was inspected and several small bleeding points were coagulated with the electrocautery until the area was completely hemostatic. The gallbladder was then detached the rest of it from the liver bed without difficulty. A laparoscopic bag was inserted through the hassan canula. The gallbladder was placed within the bag and the bag was sealed. A laparoscope was moved to the epigastric port. The bag with the gallbladder was then removed with the [facility] cannula through the infraumbilical port without difficulty. The fascial defect was then closed with the previously placed Vicryl pursestring stitch as well as with another figure-of-eight 0 Vicryl stitch. The liver bed was inspected again and found to be hemostatic. The abdomen was irrigated with copious amounts of saline until the effluent was clear. The ports were then removed under direct vision without difficulty and were found to be hemostatic. The gas was allowed to escape. The skin incisions were all closed with interrupted 4-0 Monocryl subcuticular stitches. Dermabond dressings were applied. The patient tolerated the procedure well. At the end of the case all needle sponge and instrument counts were correct. The patient was then awakened and taken to recovery in stable condition  PLAN OF CARE: Discharge to home after PACU  PATIENT DISPOSITION:  PACU - hemodynamically stable.   Delay start of Pharmacological VTE agent (>24hrs) due to surgical blood loss or risk of bleeding: not applicable

## 2014-07-12 NOTE — Progress Notes (Signed)
Dr. Berneice HeinrichManny at bedside, aware about patient's c/o "pressure to right shoulder", he said, "referred pain", "we talked about that".

## 2014-07-12 NOTE — Interval H&P Note (Signed)
History and Physical Interval Note:  07/12/2014 7:10 AM  Rhonda SnareJessica E Tate  has presented today for surgery, with the diagnosis of Biliary Colic  The various methods of treatment have been discussed with the patient and family. After consideration of risks, benefits and other options for treatment, the patient has consented to  Procedure(s): LAPAROSCOPIC CHOLECYSTECTOMY WITH INTRAOPERATIVE CHOLANGIOGRAM (N/A) as a surgical intervention .  The patient's history has been reviewed, patient examined, no change in status, stable for surgery.  I have reviewed the patient's chart and labs.  Questions were answered to the patient's satisfaction.     TOTH III,PAUL S

## 2014-07-12 NOTE — Discharge Instructions (Signed)
What to eat: ° °For your first meals, you should eat lightly; only small meals initially.  If you do not have nausea, you may eat larger meals.  Avoid spicy, greasy and heavy food.   ° °General Anesthesia, Adult, Care After  °Refer to this sheet in the next few weeks. These instructions provide you with information on caring for yourself after your procedure. Your health care provider may also give you more specific instructions. Your treatment has been planned according to current medical practices, but problems sometimes occur. Call your health care provider if you have any problems or questions after your procedure.  °WHAT TO EXPECT AFTER THE PROCEDURE  °After the procedure, it is typical to experience:  °Sleepiness.  °Nausea and vomiting. °HOME CARE INSTRUCTIONS  °For the first 24 hours after general anesthesia:  °Have a responsible person with you.  °Do not drive a car. If you are alone, do not take public transportation.  °Do not drink alcohol.  °Do not take medicine that has not been prescribed by your health care provider.  °Do not sign important papers or make important decisions.  °You may resume a normal diet and activities as directed by your health care provider.  °Change bandages (dressings) as directed.  °If you have questions or problems that seem related to general anesthesia, call the hospital and ask for the anesthetist or anesthesiologist on call. °SEEK MEDICAL CARE IF:  °You have nausea and vomiting that continue the day after anesthesia.  °You develop a rash. °SEEK IMMEDIATE MEDICAL CARE IF:  °You have difficulty breathing.  °You have chest pain.  °You have any allergic problems. °Document Released: 08/31/2000 Document Revised: 01/25/2013 Document Reviewed: 12/08/2012  °ExitCare® Patient Information ©2014 [employer].  ° °Sore Throat  ° ° °A sore throat is a painful, burning, sore, or scratchy feeling of the throat. There may be pain or tenderness when swallowing or talking. You may have  other symptoms with a sore throat. These include coughing, sneezing, fever, or a swollen neck. A sore throat is often the first sign of another sickness. These sicknesses may include a cold, flu, strep throat, or an infection called mono. Most sore throats go away without medical treatment.  °HOME CARE  °Only take medicine as told by your doctor.  °Drink enough fluids to keep your pee (urine) clear or pale yellow.  °Rest as needed.  °Try using throat sprays, lozenges, or suck on hard candy (if older than 4 years or as told).  °Sip warm liquids, such as broth, herbal tea, or warm water with honey. Try sucking on frozen ice pops or drinking cold liquids.  °Rinse the mouth (gargle) with salt water. Mix 1 teaspoon salt with 8 ounces of water.  °Do not smoke. Avoid being around others when they are smoking.  °Put a humidifier in your bedroom at night to moisten the air. You can also turn on a hot shower and sit in the bathroom for 5-10 minutes. Be sure the bathroom door is closed. °GET HELP RIGHT AWAY IF:  °You have trouble breathing.  °You cannot swallow fluids, soft foods, or your spit (saliva).  °You have more puffiness (swelling) in the throat.  °Your sore throat does not get better in 7 days.  °You feel sick to your stomach (nauseous) and throw up (vomit).  °You have a fever or lasting symptoms for more than 2-3 days.  °You have a fever and your symptoms suddenly get worse. °MAKE SURE YOU:  °Understand these   instructions.  °Will watch your condition.  °Will get help right away if you are not doing well or get worse. °Document Released: 03/03/2008 Document Revised: 02/17/2012 Document Reviewed: 01/31/2012  °ExitCare® Patient Information ©2015 [employer]. This information is not intended to replace advice given to you by your health care provider. Make sure you discuss any questions you have with your health care provider.  ° ° °Tissue Adhesive Wound Care  ° ° °Some cuts and wounds can be closed with tissue  adhesive. Adhesive is like glue. It holds the skin together and helps a wound heal faster. This adhesive goes away on its own as the wound heals.  °HOME CARE  °Showers are allowed. Do not soak the wound in water. Do not take baths, swim, or use hot tubs. Do not use soaps or creams on your wound.  °If a bandage (dressing) was put on, change it as often as told by your doctor.  °Keep the bandage dry.  °Do not scratch, pick, or rub the adhesive.  °Do not put tape over the adhesive. The adhesive could come off.  °Protect the wound from another injury.  °Protect the wound from sun and tanning beds.  °Only take medicine as told by your doctor.  °Keep all doctor visits as told. °GET HELP RIGHT AWAY IF:  °Your wound is red, puffy (swollen), hot, or tender.  °You get a rash after the glue is put on.  °You have more pain in the wound.  °You have a red streak going away from the wound.  °You have yellowish-white fluid (pus) coming from the wound.  °You have more bleeding.  °You have a fever.  °You have chills and start to shake.  °You notice a bad smell coming from the wound.  °Your wound or adhesive breaks open. °MAKE SURE YOU:  °Understand these instructions.  °Will watch your condition.  °Will get help right away if you are not doing well or get worse. °Document Released: 03/03/2008 Document Revised: 03/15/2013 Document Reviewed: 12/14/2012  °ExitCare® Patient Information ©2015 [employer]. This information is not intended to replace advice given to you by your health care provider. Make sure you discuss any questions you have with your health care provider.  ° ° °

## 2014-07-12 NOTE — H&P (Signed)
Rhonda Tate 05/29/2014 9:59 AM Location: [facility] Patient #: [id number] DOB: [birth year] Single / Language: [place] / Race: White Female  History of Present Illness Rhonda Tate; 05/29/2014 10:42 AM) Patient words: eval GB sludge.  The patient is a 29 year old female who presents with abdominal pain. We are asked to see the patient in consultation by Dr. Tandy Gaw to evaluate her for gallbladder disease. The patient is a 29 year old white female who is been experiencing severe abdominal pain since 2012. She states that the pain is mostly in her right upper quadrant. The pain has been associated with significant nausea vomiting and diarrhea. The pain will last several hours to a day or more. She feels as though she has had tender 15 attacks like this over the last month. Her most recent ultrasound showed no evidence of gallstones although she reports that a workup at [facility] in 2012 did reveal stones.   Other Problems Rhonda Tate, Rhonda Tate; 05/29/2014 9:59 AM) Anxiety Disorder Back Pain Chest pain Cholelithiasis Depression Gastroesophageal Reflux Disease Migraine Headache  Past Surgical History Rhonda Tate, Rhonda Tate; 05/29/2014 9:59 AM) Oral Surgery  Diagnostic Studies History Rhonda Tate, Rhonda Tate; 05/29/2014 9:59 AM) Colonoscopy 1-5 years ago Mammogram never Pap Smear 1-5 years ago  Allergies Rhonda Tate, Rhonda Tate; 05/29/2014 10:04 AM) Sulfa Drugs Iodinated Contrast Media Adhesive Tape  Medication History Rhonda Tate, Rhonda Tate; 05/29/2014 10:03 AM) Phentermine HCl (37.5MG  Tablet, Oral) Active. Cetirizine HCl (  Tablet, Oral) Active. Omeprazole (  Capsule DR, Oral) Active. Pantoprazole Sodium (  Tablet DR, Oral) Active. QUEtiapine Fumarate (  Tablet, Oral) Active. Sucralfate (1GM Tablet, Oral) Active. TraZODone HCl (  Tablet, Oral) Active. Venlafaxine HCl ER (  Capsule ER 24HR, Oral)  Active. Venlafaxine HCl ER (  Capsule ER 24HR, Oral) Active.  Social History Rhonda Tate, Rhonda Tate; 05/29/2014 9:59 AM) Alcohol use Occasional alcohol use. Caffeine use Tea. No drug use Tobacco use Never smoker.  Family History Rhonda Tate, Rhonda Tate; 05/29/2014 9:59 AM) Alcohol Abuse Family Members In General. Arthritis Family Members In General, Father. Breast Cancer Family Members In General. Cancer Family Members In General. Diabetes Mellitus Family Members In General. Heart Disease Family Members In General. Heart disease in female family member before age 83 Heart disease in female family member before age 68 Hypertension Family Members In General, Father. Thyroid problems Mother.  Pregnancy / Birth History Rhonda Tate, Rhonda Tate; 05/29/2014 9:59 AM) Age at menarche 9 years. Contraceptive History Depo-provera. Gravida 0 Irregular periods Para 0  Review of Systems Rhonda Tate Rhonda Tate; 05/29/2014 9:59 AM) General Present- Appetite Loss, Chills, Fatigue, Night Sweats and Weight Gain. Not Present- Fever and Weight Loss. Skin Present- Dryness. Not Present- Change in Wart/Mole, Hives, Jaundice, New Lesions, Non-Healing Wounds, Rash and Ulcer. HEENT Present- Earache, Seasonal Allergies and Wears glasses/contact lenses. Not Present- Hearing Loss, Hoarseness, Nose Bleed, Oral Ulcers, Ringing in the Ears, Sinus Pain, Sore Throat, Visual Disturbances and Yellow Eyes. Respiratory Present- Snoring. Not Present- Bloody sputum, Chronic Cough, Difficulty Breathing and Wheezing. Breast Not Present- Breast Mass, Breast Pain, Nipple Discharge and Skin Changes. Cardiovascular Present- Chest Pain, Leg Cramps and Rapid Heart Rate. Not Present- Difficulty Breathing Lying Down, Palpitations, Shortness of Breath and Swelling of Extremities. Gastrointestinal Present- Abdominal Pain, Bloating, Chronic diarrhea, Excessive gas, Indigestion and Nausea. Not Present- Bloody Stool, Change  in Bowel Habits, Constipation, Difficulty Swallowing, Gets full quickly at meals, Hemorrhoids, Rectal Pain and Vomiting. Female Genitourinary Not Present- Frequency, Nocturia, Painful Urination, Pelvic Pain and Urgency. Musculoskeletal Present- Back Pain, Joint Pain and Joint  Stiffness. Not Present- Muscle Pain, Muscle Weakness and Swelling of Extremities. Neurological Present- Headaches, Tingling and Weakness. Not Present- Decreased Memory, Fainting, Numbness, Seizures, Tremor and Trouble walking. Psychiatric Present- Anxiety, Change in Sleep Pattern and Depression. Not Present- Bipolar, Fearful and Frequent crying. Endocrine Present- Cold Intolerance and Hot flashes. Not Present- Excessive Hunger, Hair Changes, Heat Intolerance and New Diabetes. Hematology Not Present- Easy Bruising, Excessive bleeding, Gland problems, HIV and Persistent Infections.   Vitals Rhonda Tate(Rhonda Tate; 05/29/2014 10:05 AM) 05/29/2014 10:05 AM Weight: 175 lb Height: 60in Body Surface Area: 1.83 m Body Mass Index: 34.18 kg/m Temp.: 98.40F  Pulse: 94 (Regular)  BP: 118/80 (Sitting, Left Arm, Standard)    Physical Exam Rhonda Tate(Rhonda Tate; 05/29/2014 10:42 AM) General Mental Status-Alert. General Appearance-Consistent with stated age. Hydration-Well hydrated. Voice-Normal.  Head and Neck Head-normocephalic, atraumatic with no lesions or palpable masses. Trachea-midline. Thyroid Gland Characteristics - normal size and consistency.  Eye Eyeball - Bilateral-Extraocular movements intact. Sclera/Conjunctiva - Bilateral-No scleral icterus.  Chest and Lung Exam Chest and lung exam reveals -quiet, even and easy respiratory effort with no use of accessory muscles and on auscultation, normal breath sounds, no adventitious sounds and normal vocal resonance. Inspection Chest Wall - Normal. Back - normal.  Cardiovascular Cardiovascular examination reveals -normal heart sounds,  regular rate and rhythm with no murmurs and normal pedal pulses bilaterally.  Abdomen Note: The abdomen is soft. There is moderate right upper quadrant pain with palpation. There is no guarding. There is no palpable mass.   Neurologic Neurologic evaluation reveals -alert and oriented x 3 with no impairment of recent or remote memory. Mental Status-Normal.  Musculoskeletal Normal Exam - Left-Upper Extremity Strength Normal and Lower Extremity Strength Normal. Normal Exam - Right-Upper Extremity Strength Normal and Lower Extremity Strength Normal.  Lymphatic Head & Neck  General Head & Neck Lymphatics: Bilateral - Description - Normal. Axillary  General Axillary Region: Bilateral - Description - Normal. Tenderness - Non Tender. Femoral & Inguinal  Generalized Femoral & Inguinal Lymphatics: Bilateral - Description - Normal. Tenderness - Non Tender.    Assessment & Plan Rhonda Tate(Rhonda Tate; 05/29/2014 10:39 AM) COLICKY RUQ ABDOMINAL PAIN (789.01  R10.11) Impression: The patient has been having right upper quadrant pain for the last 3 years. Her recent ultrasound showed no stones in her gallbladder although she reports a scan from [facility] in 2012 where she was told she did have stones. At this point I would recommend a HIDA scan to look for dyskinesia. If this is positive then I think given the nature of her symptoms there is a good chance that she will get relief with removal of her gallbladder. If this study is negative than it is much harder to say that removing her gallbladder will help her although it would still be possible. I will call her with the results of the study and then we will proceed accordingly and decide whether to remove her gallbladder or not. Current Plans  HEPATOBILIARY DUCTAL SYSTEM IMAGING INCLUDING GALLBLADDER WITH STIMULATION ([id number])   Signed by Caleen EssexPaul S Toth, Tate (05/29/2014 10:43 AM)

## 2014-07-12 NOTE — Transfer of Care (Signed)
Immediate Anesthesia Transfer of Care Note  Patient: Rhonda SnareJessica E Tate  Procedure(s) Performed: Procedure(s): LAPAROSCOPIC CHOLECYSTECTOMY WITH INTRAOPERATIVE CHOLANGIOGRAM (N/A)  Patient Location: PACU  Anesthesia Type:General  Level of Consciousness: awake, alert , oriented and patient cooperative  Airway & Oxygen Therapy: Patient Spontanous Breathing and Patient connected to nasal cannula oxygen  Post-op Assessment: Report given to RN, Post -op Vital signs reviewed and stable and Patient moving all extremities  Post vital signs: Reviewed and stable  Last Vitals:  Filed Vitals:   07/12/14 0909  BP: 137/87  Pulse: 66  Temp:   Resp: 20    Complications: No apparent anesthesia complications

## 2014-07-16 ENCOUNTER — Encounter (HOSPITAL_COMMUNITY): Payer: Self-pay | Admitting: General Surgery

## 2014-07-30 ENCOUNTER — Other Ambulatory Visit: Payer: Self-pay | Admitting: Physician Assistant

## 2014-08-15 ENCOUNTER — Encounter: Payer: Self-pay | Admitting: Family

## 2014-08-15 ENCOUNTER — Ambulatory Visit (INDEPENDENT_AMBULATORY_CARE_PROVIDER_SITE_OTHER): Payer: 59 | Admitting: Family

## 2014-08-15 VITALS — BP 121/87 | HR 89 | Temp 97.8°F | Resp 16 | Ht 60.5 in | Wt 187.8 lb

## 2014-08-15 DIAGNOSIS — G4710 Hypersomnia, unspecified: Secondary | ICD-10-CM

## 2014-08-15 DIAGNOSIS — J309 Allergic rhinitis, unspecified: Secondary | ICD-10-CM

## 2014-08-15 DIAGNOSIS — F32A Depression, unspecified: Secondary | ICD-10-CM

## 2014-08-15 DIAGNOSIS — J302 Other seasonal allergic rhinitis: Secondary | ICD-10-CM

## 2014-08-15 DIAGNOSIS — F329 Major depressive disorder, single episode, unspecified: Secondary | ICD-10-CM

## 2014-08-15 DIAGNOSIS — R400 Somnolence: Secondary | ICD-10-CM | POA: Insufficient documentation

## 2014-08-15 MED ORDER — CETIRIZINE-PSEUDOEPHEDRINE ER 5-120 MG PO TB12: 1.0000 | ORAL_TABLET | Freq: Two times a day (BID) | ORAL | Status: DC

## 2014-08-15 MED ORDER — FLUTICASONE PROPIONATE 50 MCG/ACT NA SUSP: 2.0000 | Freq: Every day | NASAL | Status: DC
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
## 2014-08-15 NOTE — Patient Instructions (Signed)
Please call Psychiatry to arrange an appointment.  Go to ER if you have any thoughts of hurting yourself or others.  Add flonase 2 sprays to each nostril once daily (this is available OTC). Spray sinuses twice daily with nasal saline spray. Change zyrtec to zyrtec D twice daily. Call if you develop fever, if symptoms worsen or if not improved by next Monday. Please call Dr. Billey Changoth's office to arrange a follow up appointment to discuss your abdominal tenderness.

## 2014-08-15 NOTE — Assessment & Plan Note (Signed)
Current symptoms most consistent with allergic rhinitis and Eustachian tube dysfunction.   Advised pt:  Add flonase 2 sprays to each nostril once daily (this is available OTC). Spray sinuses twice daily with nasal saline spray. Change zyrtec to zyrtec D twice daily. Call if you develop fever, if symptoms worsen or if not improved by next Monday.  If symptoms do not improve consider abx

## 2014-08-15 NOTE — Assessment & Plan Note (Signed)
Suspect OSA, will obtain home sleep study.

## 2014-08-15 NOTE — Progress Notes (Signed)
Pre visit review using our clinic review tool, if applicable. No additional management support is needed unless otherwise documented below in the visit note. 

## 2014-08-15 NOTE — Assessment & Plan Note (Addendum)
Uncontrolled. Advised pt that I would recommend that she return to psychiatry, and I would defer med changes to psych.  She prefers female provider and does no wish to return to provider she saw [facility]. I have given her a list of some local female psychiatrists to call.

## 2014-08-20 ENCOUNTER — Telehealth: Payer: Self-pay | Admitting: Family

## 2014-08-20 MED ORDER — AMOXICILLIN-POT CLAVULANATE 875-125 MG PO TABS: 1.0000 | ORAL_TABLET | Freq: Two times a day (BID) | ORAL | Status: DC

## 2014-08-20 NOTE — Telephone Encounter (Signed)
Notified pt and she voices understanding. 

## 2014-08-20 NOTE — Telephone Encounter (Signed)
Caller name: Shanda Bumpsjessica Relation to pt: self Call back number: [phone] Pharmacy: [facility] [place] off [address]  Reason for call:   Patient states that she is not feeling any better since last visit. She has tried the OTC decongestant and flonase and that did not help.

## 2014-08-20 NOTE — Telephone Encounter (Signed)
Start augmentin, follow back up in office if not improved in 3 days.

## 2014-08-28 ENCOUNTER — Other Ambulatory Visit: Payer: Self-pay | Admitting: Family Medicine

## 2014-08-28 ENCOUNTER — Telehealth: Payer: Self-pay | Admitting: Family

## 2014-08-28 NOTE — Telephone Encounter (Signed)
She should be re-evaluated in the office please.  

## 2014-08-28 NOTE — Telephone Encounter (Signed)
Caller name: Servando SnareUnderwood, Rhonda E Relation to pt: self  Call back [phone] [id number] [place], [place] - [address] [facility] [address]  Reason for call:  Pt was seen 08/15/2014 and and states amoxicillin-clavulanate (AUGMENTIN) 875-125 MG per tablet is not working for sinus infection pt has not completed medication has 2 days left please advise. pt states she started running a low grade fever and no drainage.

## 2014-08-28 NOTE — Telephone Encounter (Signed)
Notified pt and scheduled appt for 08/29/14 with NP, Kandee Keenory at 8am as pt declines appt for today.

## 2014-08-29 ENCOUNTER — Ambulatory Visit (INDEPENDENT_AMBULATORY_CARE_PROVIDER_SITE_OTHER): Payer: 59 | Admitting: Adult Health

## 2014-08-29 ENCOUNTER — Encounter: Payer: Self-pay | Admitting: Adult Health

## 2014-08-29 VITALS — BP 122/100 | HR 89 | Temp 98.9°F | Resp 16 | Ht 60.5 in | Wt 187.8 lb

## 2014-08-29 DIAGNOSIS — J0100 Acute maxillary sinusitis, unspecified: Secondary | ICD-10-CM | POA: Diagnosis not present

## 2014-08-29 MED ORDER — OMEPRAZOLE 40 MG PO CPDR: 40.0000 mg | DELAYED_RELEASE_CAPSULE | Freq: Every day | ORAL | Status: DC

## 2014-08-29 MED ORDER — LEVOFLOXACIN 750 MG PO TABS: 750.0000 mg | ORAL_TABLET | Freq: Every day | ORAL | Status: DC

## 2014-08-29 MED ORDER — VENLAFAXINE HCL ER 150 MG PO CP24: 150.0000 mg | ORAL_CAPSULE | Freq: Every day | ORAL | Status: DC

## 2014-08-29 NOTE — Patient Instructions (Addendum)
I have prescribed Levofloaxacin for your sinus infection. Please take it once a day for five days. Stop taking the Augmentin as well as the Sudafed. Continue with your allergy medications. Drink plenty of fluid and get plenty of rest. If you are not feeling better by Monday, please let us know. I hope you have a good week and you start to feel better soon!

## 2014-08-29 NOTE — Progress Notes (Signed)
Pre visit review using our clinic review tool, if applicable. No additional management support is needed unless otherwise documented below in the visit note. 

## 2014-08-29 NOTE — Progress Notes (Signed)
Subjective:    Patient ID: Rhonda Tate, female    DOB: 1985-10-15, 29 y.o.   MRN: [id number]  HPI  Rhyder is returning to the office after being seen on 08/15/2014 Started Augmentin on 3/14 along with Flonase and Sudafed. She reports that she has had no improvement in sinus symptoms ( pressure and pain in sinus cavity and ears R>L). Denies productive cough. When she blows her nose, "nothing comes out." Endorses low grade fever up to 30 F that she first noticed last night.   Her right ear pain has improved   Review of Systems  Constitutional: Positive for fever, chills and fatigue. Negative for appetite change.  HENT: Positive for congestion, ear pain and sinus pressure.   Eyes: Positive for pain. Negative for itching.  Gastrointestinal: Negative for nausea, vomiting, diarrhea and constipation.   Past Medical History  Diagnosis Date  . Anemia     iron deficiency  . Depression   . Acid reflux   . Bulging disc     slight  . Ear infection   . Heartburn   . Anxiety   . Bipolar disorder     year 2009  . Liver disease     Fatty Liver Disease 2012  . Fatigue   . Tachycardia April 27, 2014  . Constipation   . Diarrhea   . Headache     History   Social History  . Marital Status: Single    Spouse Name: N/A  . Number of Children: N/A  . Years of Education: N/A   Occupational History  . Not on file.   Social History Main Topics  . Smoking status: Never Smoker   . Smokeless tobacco: Never Used  . Alcohol Use: Yes     Comment: once/ twice a month  . Drug Use: No  . Sexual Activity: Not on file   Other Topics Concern  . Not on file   Social History Narrative   Has 2 dogs and 2 cats   [employer]   Completed Bachelors   Single, no children   Lives with animales   Born in [place]   ENjoys museums, historical things, movies, friends    Past Surgical History  Procedure Laterality Date  . Wisdom tooth extraction    . Wisdom tooth extraction    .  Colonoscopy    . Upper gi endoscopy    . Cholecystectomy, laparoscopic  07/09/2014  . Cholecystectomy N/A 07/12/2014    Procedure: LAPAROSCOPIC CHOLECYSTECTOMY WITH INTRAOPERATIVE CHOLANGIOGRAM;  Surgeon: Chevis Pretty III, MD;  Location: [facility] OR;  Service: General;  Laterality: N/A;    Family History  Problem Relation Age of Onset  . Anxiety disorder Other     family hx of  . Cancer Other     breast/ family hx of  . Diabetes Other     family hx of  . Hyperlipidemia Other     family hx of  . Hypertension Other     family hx of  . Thyroid disease Other     family hx of  . Thyroid disease Mother   . GER disease Mother   . Hyperlipidemia Mother   . Other Mother     uterine polyp  . Hypertension Father   . Hyperlipidemia Father   . Diabetes Father     type II  . Gout Father   . Cancer Maternal Aunt     cancer  . Cancer Paternal Uncle  lung  . Heart attack Maternal Grandmother   . Cancer Paternal Grandmother     breast  . Heart disease Paternal Grandfather     heart failure    Allergies  Allergen Reactions  . Iodinated Diagnostic Agents Other (See Comments)    Numbness on right side of body (only from MRI dye)  . Other Other (See Comments)    Vanilla causes migraines  . Risperidone And Related Other (See Comments)    Uncontrollable twitching  . Trazodone And Nefazodone Diarrhea and Other (See Comments)    Severe abdominal cramping, loss of appetite  . Wellbutrin [Bupropion] Other (See Comments)    Elevated BP, tacycardia, insomnia, no appetite  . Hydromorphone Hcl Itching, Nausea And Vomiting and Rash  . Sulfa Antibiotics Nausea And Vomiting and Rash  . Tape Itching and Rash    Paper tape ok     BP 122/100 mmHg  Pulse 89  Temp(Src) 98.9 F (37.2 C) (Oral)  Resp 16  Ht 5' 0.5" (1.537 m)  Wt 187 lb 12.8 oz (85.186 kg)  BMI 36.06 kg/m2  SpO2 99%       Objective:   Physical Exam  Constitutional: She is oriented to person, place, and time. She appears  well-developed and well-nourished.  HENT:  Right Ear: External ear normal.  Left Ear: External ear normal.  Mouth/Throat: Oropharynx is clear and moist.  Right frontal and maxillary sinus pressure with palpation  Eyes: Right eye exhibits no discharge. Left eye exhibits no discharge.  Cardiovascular: Normal rate, regular rhythm and normal heart sounds.  Exam reveals no gallop and no friction rub.   No murmur heard. Pulmonary/Chest: Effort normal and breath sounds normal. No respiratory distress. She has no wheezes. She has no rales.  Neurological: She is alert and oriented to person, place, and time.  Skin: Skin is warm and dry.  Psychiatric: She has a normal mood and affect. Her behavior is normal.         Assessment & Plan:  Acute Sinus Infection - Stop Augmentin as it does not seem to be working well - Take prescribed Levoquin 750mg  daily for five days.  - Stop taking Sudafed  - Continue to drink plenty of fluid and get plenty of rest - Follow up in three days if no improvement

## 2014-09-10 ENCOUNTER — Telehealth: Payer: Self-pay | Admitting: Family

## 2014-09-10 NOTE — Telephone Encounter (Signed)
I would recommend that she contact [facility] [phone]  (she should be able to request another provider) . Unfortunately, I am not able to schedule psych appointments, they need to be done by the patient.  Please confirm no SI/HI presently.  Let me know if she has trouble getting a sooner appointment.

## 2014-09-10 NOTE — Telephone Encounter (Signed)
Caller name:Bo, Donnice Relation to JX:BJYNpt:self Call back [phone] Pharmacy:  Reason for call: pt would like to know if Efraim KaufmannMelissa can help her out with getting an appointment with behavioral health sooner than 04-19-15, pt states she is not happy with the medication she is currently on, states Melissa is aware of that and had suggested that she see the behavioral health doctor. She has appointment on 04-19-15 with Dr. Evelene CroonKaur [phone]. States the medicine makes her feel foggy, no energy,  Dreams that are not of the norm, trouble concentrating. States she loves Melissa.

## 2014-09-11 NOTE — Telephone Encounter (Signed)
Notified pt and she voices understanding. Denies current SI/HI thoughts.

## 2014-10-02 DIAGNOSIS — G4733 Obstructive sleep apnea (adult) (pediatric): Secondary | ICD-10-CM | POA: Diagnosis not present

## 2014-10-03 DIAGNOSIS — G4733 Obstructive sleep apnea (adult) (pediatric): Secondary | ICD-10-CM | POA: Diagnosis not present

## 2014-10-04 ENCOUNTER — Other Ambulatory Visit: Payer: Self-pay | Admitting: *Deleted

## 2014-10-04 ENCOUNTER — Telehealth: Payer: Self-pay | Admitting: Family

## 2014-10-04 ENCOUNTER — Ambulatory Visit (INDEPENDENT_AMBULATORY_CARE_PROVIDER_SITE_OTHER): Payer: 59 | Admitting: Psychiatry

## 2014-10-04 ENCOUNTER — Encounter (HOSPITAL_COMMUNITY): Payer: Self-pay | Admitting: Psychiatry

## 2014-10-04 VITALS — BP 134/99 | HR 81 | Ht 60.0 in | Wt 190.0 lb

## 2014-10-04 DIAGNOSIS — F331 Major depressive disorder, recurrent, moderate: Secondary | ICD-10-CM | POA: Insufficient documentation

## 2014-10-04 DIAGNOSIS — F4010 Social phobia, unspecified: Secondary | ICD-10-CM | POA: Diagnosis not present

## 2014-10-04 DIAGNOSIS — F410 Panic disorder [episodic paroxysmal anxiety] without agoraphobia: Secondary | ICD-10-CM | POA: Diagnosis not present

## 2014-10-04 DIAGNOSIS — F4310 Post-traumatic stress disorder, unspecified: Secondary | ICD-10-CM | POA: Diagnosis not present

## 2014-10-04 DIAGNOSIS — R400 Somnolence: Secondary | ICD-10-CM

## 2014-10-04 DIAGNOSIS — G4733 Obstructive sleep apnea (adult) (pediatric): Secondary | ICD-10-CM

## 2014-10-04 MED ORDER — ESCITALOPRAM OXALATE 20 MG PO TABS: 20.0000 mg | ORAL_TABLET | Freq: Every day | ORAL | Status: DC

## 2014-10-04 MED ORDER — VENLAFAXINE HCL ER 37.5 MG PO CP24: ORAL_CAPSULE | ORAL | Status: DC

## 2014-10-04 MED ORDER — LITHIUM CARBONATE ER 300 MG PO TBCR: 300.0000 mg | EXTENDED_RELEASE_TABLET | Freq: Two times a day (BID) | ORAL | Status: DC

## 2014-10-04 NOTE — Telephone Encounter (Signed)
-----   Message from Barbaraann ShareKeith M Clance, MD sent at 10/03/2014  5:36 PM EDT ----- Efraim KaufmannMelissa, just wanted to let you know this pt's sleep study showed mild OSA, with an AHI of 9/hr.  Not a health risk for her, can be treated with weight loss alone or more aggressive treatment if she is symptomatic.   Should be scanned into computer in next 48hrs for your review. Let us know if any questions.
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
## 2014-10-04 NOTE — Telephone Encounter (Signed)
Please let pt know that her sleep study showed mild OSA.  CPAP is optional.  She could focus on weight loss alone, but if she would like to try cpap to see if this helps her wake up feeling more rested I will order.  It is up to her.

## 2014-10-05 NOTE — Telephone Encounter (Signed)
Spoke with patient and advised. Would like to try the CPAP. Also, wanted you to know that she had her psychiatry visit 4/28 and it went very well.  Patient scheduled CPE for May.

## 2014-10-05 NOTE — Addendum Note (Signed)
Addended by: Sandford Craze'SULLIVAN, Donella Pascarella on: 10/05/2014 06:07 PM   Modules accepted: Orders

## 2014-10-08 ENCOUNTER — Telehealth (HOSPITAL_COMMUNITY): Payer: Self-pay

## 2014-10-08 NOTE — Telephone Encounter (Signed)
Medication management - pharmacist wants verification okay to fill patient's new lithobid as stated concern of interaction with Effexor for patient as has noted patient is sensitive to interactions in the past. Would like to double check with you to make sure okay to fill?

## 2014-10-08 NOTE — Addendum Note (Signed)
Addended by: Sandford Craze'SULLIVAN, Luella Gardenhire on: 10/08/2014 03:00 PM   Modules accepted: Orders

## 2014-10-08 NOTE — Telephone Encounter (Signed)
cpap ordered

## 2014-10-09 ENCOUNTER — Telehealth: Payer: Self-pay | Admitting: Family

## 2014-10-09 NOTE — Telephone Encounter (Signed)
Called patient back. Patient stated that she received Shawn T., RN's message and she is willing to do as he said.  Patient stated that she will need pharmacy([facility]) notified that it is okay to fill Lithium.  I advised patient I will call pharmacy, tell them to fill Lithium and advised patient to call us back if she has any further concerns.  Patient verbalized understanding.   [facility] [place]. [place]Brittany will fill have Lithium filled.

## 2014-10-09 NOTE — Telephone Encounter (Signed)
Met with Dr. Doyne Keel to discuss patient's status with plans to continue to taper patient off of Effexor while starting her on Lithobid.  Agreed to call patient to inform she could cross taper the lithobid with her lowering dosages of Effexor or if patient was too concerned about this as she has been sensitive to Effexor withdrawal in the past, then she could continue to complete her taper plans from Effexor and then start Lithobid after. Left patient a message with this instruction and also agreed to contact patient's Vivian with same instructions to follow up on their call from 10/08/14.  Called Estill Bamberg at the [facility] on [address] in [place] to inform of Dr. Havery Moros instructions and that those instructions were left on a message for patient.  Agreed patient could fill lithobid and take with Effexor as Dr. Doyne Keel tapers her off the medication or she could wait until the end of her taper to start the Lithobid.  Pharmacist stated understanding the instructions and will fill if patient in agreement with plan. Requested patient call back if any questions on message left for her.

## 2014-10-09 NOTE — Telephone Encounter (Signed)
Pre visit letter sent  °

## 2014-10-09 NOTE — Telephone Encounter (Signed)
Yes we are doing a slow taper off of Effexor. She should be off of Effexor in 2 weeks.

## 2014-10-18 NOTE — Telephone Encounter (Signed)
Victorino DikeJennifer,  Could you please check with [facility] and update patient re: CPAO status?  Thanks- News Corporationmelissa

## 2014-10-19 NOTE — Telephone Encounter (Signed)
Order was faxed on 10/08/14, lm on vm it may take a week or two for insurance to approve.

## 2014-10-25 ENCOUNTER — Encounter: Payer: Self-pay | Admitting: Family

## 2014-10-29 ENCOUNTER — Encounter: Payer: Self-pay | Admitting: *Deleted

## 2014-10-29 ENCOUNTER — Telehealth: Payer: Self-pay | Admitting: *Deleted

## 2014-10-29 NOTE — Telephone Encounter (Signed)
Pre-Visit Call completed with patient and chart updated.   Pre-Visit Info documented in Specialty Comments under [employer].    

## 2014-10-30 ENCOUNTER — Encounter: Payer: Self-pay | Admitting: Family

## 2014-10-30 ENCOUNTER — Other Ambulatory Visit (HOSPITAL_COMMUNITY)
Admission: RE | Admit: 2014-10-30 | Discharge: 2014-10-30 | Disposition: A | Discharge status: Home / Self Care | Payer: 59 | Source: Ambulatory Visit | Attending: Family Medicine | Admitting: Family Medicine

## 2014-10-30 ENCOUNTER — Ambulatory Visit (INDEPENDENT_AMBULATORY_CARE_PROVIDER_SITE_OTHER): Payer: 59 | Admitting: Family

## 2014-10-30 VITALS — BP 118/78 | HR 72 | Temp 98.2°F | Resp 16 | Ht 60.5 in | Wt 194.0 lb

## 2014-10-30 DIAGNOSIS — Z01419 Encounter for gynecological examination (general) (routine) without abnormal findings: Secondary | ICD-10-CM | POA: Diagnosis not present

## 2014-10-30 DIAGNOSIS — Z0000 Encounter for general adult medical examination without abnormal findings: Secondary | ICD-10-CM | POA: Diagnosis not present

## 2014-10-30 DIAGNOSIS — L989 Disorder of the skin and subcutaneous tissue, unspecified: Secondary | ICD-10-CM | POA: Diagnosis not present

## 2014-10-30 LAB — URINALYSIS, ROUTINE W REFLEX MICROSCOPIC
BILIRUBIN URINE: NEGATIVE
Hgb urine dipstick: NEGATIVE
Ketones, ur: NEGATIVE
LEUKOCYTES UA: NEGATIVE
NITRITE: NEGATIVE
Specific Gravity, Urine: >=1.03 — AB (ref 1.000–1.030)
TOTAL PROTEIN, URINE-UPE24: NEGATIVE
URINE GLUCOSE: NEGATIVE
Urobilinogen, UA: 0.2 (ref 0.0–1.0)
pH: 6 (ref 5.0–8.0)

## 2014-10-30 LAB — CBC WITH DIFFERENTIAL/PLATELET
BASOS ABS: 0 10*3/uL (ref 0.0–0.1)
BASOS PCT: 0.5 % (ref 0.0–3.0)
Eosinophils Absolute: 0 10*3/uL (ref 0.0–0.7)
Eosinophils Relative: 0.1 % (ref 0.0–5.0)
HEMATOCRIT: 38.9 % (ref 36.0–46.0)
HEMOGLOBIN: 13.2 g/dL (ref 12.0–15.0)
LYMPHS ABS: 1.9 10*3/uL (ref 0.7–4.0)
Lymphocytes Relative: 26.7 % (ref 12.0–46.0)
MCHC: 34 g/dL (ref 30.0–36.0)
MCV: 85.3 fl (ref 78.0–100.0)
MONOS PCT: 5.6 % (ref 3.0–12.0)
Monocytes Absolute: 0.4 10*3/uL (ref 0.1–1.0)
Neutro Abs: 4.8 10*3/uL (ref 1.4–7.7)
Neutrophils Relative %: 67.1 % (ref 43.0–77.0)
PLATELETS: 298 10*3/uL (ref 150.0–400.0)
RBC: 4.57 Mil/uL (ref 3.87–5.11)
RDW: 13.4 % (ref 11.5–15.5)
WBC: 7.1 10*3/uL (ref 4.0–10.5)

## 2014-10-30 LAB — HEPATIC FUNCTION PANEL
ALBUMIN: 4.6 g/dL (ref 3.5–5.2)
ALT: 57 U/L — ABNORMAL HIGH (ref 0–35)
AST: 35 U/L (ref 0–37)
Alkaline Phosphatase: 69 U/L (ref 39–117)
BILIRUBIN DIRECT: 0.1 mg/dL (ref 0.0–0.3)
BILIRUBIN TOTAL: 0.4 mg/dL (ref 0.2–1.2)
TOTAL PROTEIN: 7.7 g/dL (ref 6.0–8.3)

## 2014-10-30 LAB — LIPID PANEL
Cholesterol: 164 mg/dL (ref 0–200)
HDL: 39.2 mg/dL (ref >39.00)
NonHDL: 124.8
Total CHOL/HDL Ratio: 4
Triglycerides: 236 mg/dL — ABNORMAL HIGH (ref 0.0–149.0)
VLDL: 47.2 mg/dL — ABNORMAL HIGH (ref 0.0–40.0)

## 2014-10-30 LAB — LDL CHOLESTEROL, DIRECT: Direct LDL: 95 mg/dL

## 2014-10-30 LAB — TSH: TSH: 2.71 u[IU]/mL (ref 0.35–4.50)

## 2014-10-30 NOTE — Progress Notes (Signed)
Pre visit review using our clinic review tool, if applicable. No additional management support is needed unless otherwise documented below in the visit note. 

## 2014-10-30 NOTE — Addendum Note (Signed)
Addended by: Mervin KungFERGERSON, Amaury Kuzel A on: 10/30/2014 01:10 PM   Modules accepted: Orders

## 2014-10-30 NOTE — Progress Notes (Signed)
Subjective:    Patient ID: Rhonda Tate, female    DOB: Jan 16, 1986, 29 y.o.   MRN: [id number]  HPI  Rhonda Tate is a 29 yr old female who presents today for cpx.  Immunizations: tetanus up to date Diet: reports that she is eating 1200 calories but is gaining weight.   Wt Readings from Last 3 Encounters:  10/30/14 194 lb (87.998 kg)  10/04/14 190 lb (86.183 kg)  08/29/14 187 lb 12.8 oz (85.186 kg)  Exercise: some, not as much as she was.   Pap Smear: due Dental: due Eye exam: had one 3-4 months ago  Stopped depo in February, no period since.  Not sexually active.    Review of Systems  Constitutional: Positive for unexpected weight change.  HENT: Positive for rhinorrhea. Negative for hearing loss.   Eyes: Negative for visual disturbance.  Respiratory: Negative for cough and shortness of breath.   Cardiovascular: Negative for leg swelling.  Gastrointestinal: Positive for diarrhea. Negative for nausea and constipation.       Diarrhea since her cholecystectomy  Genitourinary: Negative for dysuria and frequency.  Musculoskeletal: Negative for myalgias.       Occasional right knee pain  Skin: Negative for rash.  Neurological: Negative for headaches.  Hematological:       Notes some tenderness left cervical LN's.    Psychiatric/Behavioral: Negative for dysphoric mood and agitation.   Past Medical History  Diagnosis Date  . Anemia     iron deficiency  . Depression   . Acid reflux   . Bulging disc     slight  . Ear infection   . Heartburn   . Anxiety   . Bipolar disorder     year 2009  . Liver disease     Fatty Liver Disease 2012  . Fatigue   . Tachycardia April 27, 2014  . Constipation   . Diarrhea   . Headache   . OSA (obstructive sleep apnea)     mild    History   Social History  . Marital Status: Single    Spouse Name: N/A  . Number of Children: N/A  . Years of Education: N/A   Occupational History  . Not on file.   Social History Main  Topics  . Smoking status: Never Smoker   . Smokeless tobacco: Never Used  . Alcohol Use: Yes     Comment: a few times a year- drink 1-2 drinks per episode  . Drug Use: No  . Sexual Activity: Not on file   Other Topics Concern  . Not on file   Social History Narrative   Has 2 dogs and 2 cats   Junior Account   Completed Bachelors   Single, no children   Lives with animales   Born in [place]   ENjoys museums, historical things, movies, friends    Past Surgical History  Procedure Laterality Date  . Wisdom tooth extraction    . Wisdom tooth extraction    . Colonoscopy    . Upper gi endoscopy    . Cholecystectomy, laparoscopic  07/09/2014  . Cholecystectomy N/A 07/12/2014    Procedure: LAPAROSCOPIC CHOLECYSTECTOMY WITH INTRAOPERATIVE CHOLANGIOGRAM;  Surgeon: Chevis Pretty III, MD;  Location: [facility] OR;  Service: General;  Laterality: N/A;    Family History  Problem Relation Age of Onset  . Cancer Other     breast/ family hx of  . Diabetes Other     family hx of  . Hyperlipidemia Other  family hx of  . Hypertension Other     family hx of  . Thyroid disease Other     family hx of  . Thyroid disease Mother   . GER disease Mother   . Hyperlipidemia Mother   . Other Mother     uterine polyp  . Hypertension Father   . Hyperlipidemia Father   . Diabetes Father     type II  . Gout Father   . Cancer Maternal Aunt     breast cancer 50's  . Drug abuse Maternal Aunt   . Cancer Paternal Uncle     lung  . Heart attack Maternal Grandmother   . Cancer Paternal Grandmother     breast 70's  . Heart disease Paternal Grandfather     heart failure  . Drug abuse Maternal Uncle   . Drug abuse Cousin   . Alcohol abuse Neg Hx   . Anxiety disorder Neg Hx   . Bipolar disorder Neg Hx   . Depression Neg Hx     Allergies  Allergen Reactions  . Iodinated Diagnostic Agents Other (See Comments)    Numbness on right side of body (only from MRI dye)  . Other Other (See Comments)     Vanilla causes migraines  . Risperidone And Related Other (See Comments)    Uncontrollable twitching  . Trazodone And Nefazodone Diarrhea and Other (See Comments)    Severe abdominal cramping, loss of appetite  . Wellbutrin [Bupropion] Other (See Comments)    Elevated BP, tacycardia, insomnia, no appetite (only when taking phentermine)   . Hydromorphone Hcl Itching, Nausea And Vomiting and Rash  . Sulfa Antibiotics Nausea And Vomiting and Rash  . Tape Itching and Rash    Paper tape ok    Current Outpatient Prescriptions on File Prior to Visit  Medication Sig Dispense Refill  . cetirizine-pseudoephedrine (ZYRTEC-D) 5-120 MG per tablet Take 1 tablet by mouth 2 (two) times daily.    Marland Kitchen escitalopram (LEXAPRO) 20 MG tablet Take 1 tablet (20 mg total) by mouth daily. 30 tablet 1  . fluticasone (FLONASE) 50 MCG/ACT nasal spray Place 2 sprays into both nostrils daily. 16 g 6  . lithium carbonate (LITHOBID) 300 MG CR tablet Take 1 tablet (300 mg total) by mouth 2 (two) times daily. (Patient taking differently: Take 300 mg by mouth daily. ) 30 tablet 1  . omeprazole (PRILOSEC) 40 MG capsule Take 1 capsule (40 mg total) by mouth daily. 30 capsule 3   No current facility-administered medications on file prior to visit.    BP 118/78 mmHg  Pulse 72  Temp(Src) 98.2 F (36.8 C) (Oral)  Resp 16  Ht 5' 0.5" (1.537 m)  Wt 194 lb (87.998 kg)  BMI 37.25 kg/m2  SpO2 99%  LMP 07/09/2014       Objective:   Physical Exam  Physical Exam  Constitutional: She is oriented to person, place, and time. She appears well-developed and well-nourished. No distress.  HENT:  Head: Normocephalic and atraumatic.  Right Ear: Tympanic membrane and ear canal normal.  Left Ear: Tympanic membrane and ear canal normal.  Mouth/Throat: Oropharynx is clear and moist.  Eyes: Pupils are equal, round, and reactive to light. No scleral icterus.  Neck: Normal range of motion. No thyromegaly present.  Cardiovascular:  Normal rate and regular rhythm.   No murmur heard. Pulmonary/Chest: Effort normal and breath sounds normal. No respiratory distress. He has no wheezes. She has no rales. She exhibits no tenderness.  Abdominal: Soft. Bowel sounds are normal. He exhibits no distension and no mass. There is no tenderness. There is no rebound and no guarding.  Musculoskeletal: She exhibits no edema.  Lymphadenopathy:    She has no cervical adenopathy.  Neurological: She is alert and oriented to person, place, and time. She has normal reflexes. She exhibits normal muscle tone. Coordination normal.  Skin: Skin is warm and dry. multiple tattoos. Multiple dark nevi on trunk, irregular nevus noted between scapula. Psychiatric: She has a normal mood and affect. Her behavior is normal. Judgment and thought content normal.  Breasts: Examined lying Right: Without masses, retractions, discharge or axillary adenopathy.  Left: Without masses, retractions, discharge or axillary adenopathy.  Inguinal/mons: Normal without inguinal adenopathy  External genitalia: Normal  BUS/Urethra/Skene's glands: Normal  Bladder: Normal  Vagina: Normal  Cervix: Normal  Uterus: normal in size, shape and contour. Midline and mobile  Adnexa/parametria:  Rt: Without masses or tenderness.  Lt: Without masses or tenderness.  Anus and perineum: Normal           Assessment & Plan:         Assessment & Plan:

## 2014-10-30 NOTE — Patient Instructions (Signed)
Please complete lab work prior to leaving.  Try cutting calories down to 1000/day and adding regular exercise. Follow up in 1 year, sooner if problems/concerns.

## 2014-10-30 NOTE — Assessment & Plan Note (Addendum)
Discussed healthy diet, exercise, weight loss. Immunizations reviewed and up to date. Pap smear today. I think it is reasonable to start mammogram at 1740 since family members had breast CA 1750+ years of age.

## 2014-10-31 LAB — CYTOLOGY - PAP

## 2014-10-31 LAB — HIV ANTIBODY (ROUTINE TESTING W REFLEX): HIV: NONREACTIVE

## 2014-11-02 ENCOUNTER — Encounter: Payer: Self-pay | Admitting: Family

## 2014-11-15 ENCOUNTER — Ambulatory Visit (INDEPENDENT_AMBULATORY_CARE_PROVIDER_SITE_OTHER): Payer: 59 | Admitting: Psychiatry

## 2014-11-15 ENCOUNTER — Encounter (HOSPITAL_COMMUNITY): Payer: Self-pay | Admitting: Psychiatry

## 2014-11-15 VITALS — BP 122/73 | HR 76 | Ht 60.0 in | Wt 189.4 lb

## 2014-11-15 DIAGNOSIS — F4010 Social phobia, unspecified: Secondary | ICD-10-CM

## 2014-11-15 DIAGNOSIS — F410 Panic disorder [episodic paroxysmal anxiety] without agoraphobia: Secondary | ICD-10-CM

## 2014-11-15 DIAGNOSIS — F331 Major depressive disorder, recurrent, moderate: Secondary | ICD-10-CM | POA: Diagnosis not present

## 2014-11-15 DIAGNOSIS — F4310 Post-traumatic stress disorder, unspecified: Secondary | ICD-10-CM | POA: Diagnosis not present

## 2014-11-15 MED ORDER — LITHIUM CARBONATE ER 300 MG PO TBCR: 300.0000 mg | EXTENDED_RELEASE_TABLET | Freq: Every day | ORAL | Status: DC

## 2014-11-15 MED ORDER — ESCITALOPRAM OXALATE 20 MG PO TABS: 20.0000 mg | ORAL_TABLET | Freq: Every day | ORAL | Status: DC
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
## 2015-01-11 ENCOUNTER — Other Ambulatory Visit: Payer: Self-pay | Admitting: Family Medicine

## 2015-01-29 ENCOUNTER — Encounter: Payer: Self-pay | Admitting: Family

## 2015-02-15 ENCOUNTER — Other Ambulatory Visit: Payer: Self-pay | Admitting: Family Medicine

## 2015-02-26 ENCOUNTER — Encounter (HOSPITAL_COMMUNITY): Payer: Self-pay | Admitting: Psychiatry

## 2015-02-26 ENCOUNTER — Ambulatory Visit (INDEPENDENT_AMBULATORY_CARE_PROVIDER_SITE_OTHER): Payer: 59 | Admitting: Psychiatry

## 2015-02-26 VITALS — BP 135/88 | HR 71 | Ht 60.0 in | Wt 190.0 lb

## 2015-02-26 DIAGNOSIS — F331 Major depressive disorder, recurrent, moderate: Secondary | ICD-10-CM

## 2015-02-26 DIAGNOSIS — F4310 Post-traumatic stress disorder, unspecified: Secondary | ICD-10-CM

## 2015-02-26 DIAGNOSIS — F410 Panic disorder [episodic paroxysmal anxiety] without agoraphobia: Secondary | ICD-10-CM

## 2015-02-26 DIAGNOSIS — F418 Other specified anxiety disorders: Secondary | ICD-10-CM | POA: Diagnosis not present

## 2015-02-26 DIAGNOSIS — Z79899 Other long term (current) drug therapy: Secondary | ICD-10-CM

## 2015-02-26 DIAGNOSIS — F4010 Social phobia, unspecified: Secondary | ICD-10-CM

## 2015-02-26 MED ORDER — ESCITALOPRAM OXALATE 20 MG PO TABS: 20.0000 mg | ORAL_TABLET | Freq: Every day | ORAL | Status: DC

## 2015-02-26 MED ORDER — LITHIUM CARBONATE ER 300 MG PO TBCR: 600.0000 mg | EXTENDED_RELEASE_TABLET | Freq: Every day | ORAL | Status: DC
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
## 2015-03-25 ENCOUNTER — Encounter: Payer: Self-pay | Admitting: Family

## 2015-03-25 ENCOUNTER — Ambulatory Visit (INDEPENDENT_AMBULATORY_CARE_PROVIDER_SITE_OTHER): Payer: Commercial Managed Care - PPO | Admitting: Family

## 2015-03-25 VITALS — BP 129/76 | HR 69 | Temp 98.0°F | Ht 60.0 in | Wt 195.2 lb

## 2015-03-25 DIAGNOSIS — K219 Gastro-esophageal reflux disease without esophagitis: Secondary | ICD-10-CM

## 2015-03-25 DIAGNOSIS — J309 Allergic rhinitis, unspecified: Secondary | ICD-10-CM | POA: Diagnosis not present

## 2015-03-25 DIAGNOSIS — F32A Depression, unspecified: Secondary | ICD-10-CM

## 2015-03-25 DIAGNOSIS — R5382 Chronic fatigue, unspecified: Secondary | ICD-10-CM | POA: Diagnosis not present

## 2015-03-25 DIAGNOSIS — F329 Major depressive disorder, single episode, unspecified: Secondary | ICD-10-CM

## 2015-03-25 MED ORDER — RABEPRAZOLE SODIUM 20 MG PO TBEC: 20.0000 mg | DELAYED_RELEASE_TABLET | Freq: Every day | ORAL | Status: DC

## 2015-03-25 MED ORDER — BECLOMETHASONE DIPROPIONATE 80 MCG/ACT NA AERS: 2.0000 | INHALATION_SPRAY | Freq: Every day | NASAL | Status: DC

## 2015-03-25 NOTE — Progress Notes (Signed)
Subjective:    Patient ID: Rhonda Tate, female    DOB: Oct 26, 1985, 29 y.o.   MRN: [id number]  HPI   Pt saw psych last month and depression symptoms were noted to be uncontrolled.  Her lithium was increased.  She was also placed on lexapro 20mg .    Her chief complaint today is fatigue.  She reports good compliance with CPAP since April. Reports that she sleeps the whol night trough since she started the  Reports sleeping well, having trouble staying awake during the day.  Has had severe fatigue for 2-3 years.  Seems to be getting worse.    GERD- insurance will not cover omeprazole, but will cover aciphex.   Allergic rhinitis- insurance will not cover flonase, will cover qnasl.    Review of Systems See HPI  Past Medical History  Diagnosis Date  . Anemia     iron deficiency  . Depression   . Acid reflux   . Bulging disc     slight  . Ear infection   . Heartburn   . Anxiety   . Bipolar disorder (HCC)     year 2009  . Liver disease     Fatty Liver Disease 2012  . Fatigue   . Tachycardia April 27, 2014  . Constipation   . Diarrhea   . Headache   . OSA (obstructive sleep apnea)     mild  . PCOS (polycystic ovarian syndrome)     Social History   Social History  . Marital Status: Single    Spouse Name: N/A  . Number of Children: N/A  . Years of Education: N/A   Occupational History  . Not on file.   Social History Main Topics  . Smoking status: Never Smoker   . Smokeless tobacco: Never Used  . Alcohol Use: Yes     Comment: a few times a year- drink 1-2 drinks per episode  . Drug Use: No  . Sexual Activity: Not on file   Other Topics Concern  . 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 Wisdom tooth extraction     . Wisdom tooth extraction    . Colonoscopy    . Upper gi endoscopy    . Cholecystectomy, laparoscopic  07/09/2014  . Cholecystectomy N/A 07/12/2014    Procedure: LAPAROSCOPIC CHOLECYSTECTOMY WITH INTRAOPERATIVE CHOLANGIOGRAM;  Surgeon: Chevis PrettyPaul Toth III, MD;  Location: [facility] OR;  Service: General;  Laterality: N/A;    Family History  Problem Relation Age of Onset  . Cancer Other     breast/ family hx of  . Diabetes Other     family hx of  . Hyperlipidemia Other     family hx of  . Hypertension Other     family hx of  . Thyroid disease Other     family hx of  . Thyroid disease Mother   . GER disease Mother   . Hyperlipidemia Mother   . Other Mother     uterine polyp  . Hypertension Father   . Hyperlipidemia Father   . Diabetes Father     type II  . Gout Father   . Cancer Maternal Aunt     breast  cancer 50's  . Drug abuse Maternal Aunt   . Cancer Paternal Uncle     lung  . Heart attack Maternal Grandmother   . Cancer Paternal Grandmother     breast 70's  . Heart disease Paternal Grandfather     heart failure  . Drug abuse Maternal Uncle   . Drug abuse Cousin   . Alcohol abuse Neg Hx   . Anxiety disorder Neg Hx   . Bipolar disorder Neg Hx   . Depression Neg Hx     Allergies  Allergen Reactions  . Iodinated Diagnostic Agents Other (See Comments)    Numbness on right side of body (only from MRI dye)  . Other Other (See Comments)    Vanilla causes migraines  . Risperidone And Related Other (See Comments)    Uncontrollable twitching  . Trazodone And Nefazodone Diarrhea and Other (See Comments)    Severe abdominal cramping, loss of appetite  . Wellbutrin [Bupropion] Other (See Comments)    Elevated BP, tacycardia, insomnia, no appetite (only when taking phentermine)   . Hydromorphone Hcl Itching, Nausea And Vomiting and Rash  . Sulfa Antibiotics Nausea And Vomiting and Rash  . Tape Itching and Rash    Paper tape ok    Current Outpatient Prescriptions on File Prior to  Visit  Medication Sig Dispense Refill  . escitalopram (LEXAPRO) 20 MG tablet Take 1 tablet (20 mg total) by mouth daily. 30 tablet 1  . lithium carbonate (LITHOBID) 300 MG CR tablet Take 2 tablets (600 mg total) by mouth daily. 60 tablet 1   No current facility-administered medications on file prior to visit.    BP 129/76 mmHg  Pulse 69  Temp(Src) 98 F (36.7 C) (Oral)  Ht 5' (1.524 m)  Wt 195 lb 3.2 oz (88.542 kg)  BMI 38.12 kg/m2  SpO2 100%       Objective:   Physical Exam  Constitutional: She appears well-developed and well-nourished.  Cardiovascular: Normal rate, regular rhythm and normal heart sounds.   No murmur heard. Pulmonary/Chest: Effort normal and breath sounds normal. No respiratory distress. She has no wheezes.  Psychiatric: She has a normal mood and affect. Her behavior is normal. Judgment and thought content normal.          Assessment & Plan:  Chronic Fatigue-  Will obtai serum cortisol level to evaluate for renal insufficiency, TFT's to rule out hypothyroid, CBC to rule out anemia. It is still possible that depression is primary cause of her fatigue.

## 2015-03-25 NOTE — Patient Instructions (Signed)
Please complete lab work prior to leaving.   

## 2015-03-25 NOTE — Progress Notes (Signed)
Pre visit review using our clinic review tool, if applicable. No additional management support is needed unless otherwise documented below in the visit note. 

## 2015-03-26 ENCOUNTER — Other Ambulatory Visit (INDEPENDENT_AMBULATORY_CARE_PROVIDER_SITE_OTHER): Payer: Commercial Managed Care - PPO

## 2015-03-26 DIAGNOSIS — R5382 Chronic fatigue, unspecified: Secondary | ICD-10-CM

## 2015-03-26 LAB — T4, FREE: FREE T4: 0.58 ng/dL — AB (ref 0.60–1.60)

## 2015-03-26 LAB — CBC WITH DIFFERENTIAL/PLATELET
BASOS ABS: 0 10*3/uL (ref 0.0–0.1)
Basophils Relative: 0.7 % (ref 0.0–3.0)
Eosinophils Absolute: 0.1 10*3/uL (ref 0.0–0.7)
Eosinophils Relative: 2.4 % (ref 0.0–5.0)
HEMATOCRIT: 38.1 % (ref 36.0–46.0)
HEMOGLOBIN: 12.7 g/dL (ref 12.0–15.0)
LYMPHS ABS: 1.7 10*3/uL (ref 0.7–4.0)
LYMPHS PCT: 28.9 % (ref 12.0–46.0)
MCHC: 33.4 g/dL (ref 30.0–36.0)
MCV: 85.1 fl (ref 78.0–100.0)
MONOS PCT: 5.7 % (ref 3.0–12.0)
Monocytes Absolute: 0.3 10*3/uL (ref 0.1–1.0)
NEUTROS PCT: 62.3 % (ref 43.0–77.0)
Neutro Abs: 3.6 10*3/uL (ref 1.4–7.7)
Platelets: 279 10*3/uL (ref 150.0–400.0)
RBC: 4.48 Mil/uL (ref 3.87–5.11)
RDW: 13.2 % (ref 11.5–15.5)
WBC: 5.8 10*3/uL (ref 4.0–10.5)

## 2015-03-26 LAB — CORTISOL: CORTISOL PLASMA: 17.2 ug/dL

## 2015-03-26 LAB — T3, FREE: T3, Free: 3.1 pg/mL (ref 2.3–4.2)

## 2015-03-26 LAB — TSH: TSH: 3.51 u[IU]/mL (ref 0.35–4.50)

## 2015-03-27 ENCOUNTER — Encounter: Payer: Self-pay | Admitting: Family

## 2015-03-27 DIAGNOSIS — E038 Other specified hypothyroidism: Secondary | ICD-10-CM

## 2015-03-27 LAB — LITHIUM LEVEL: LITHIUM LVL: 0.1 meq/L — AB (ref 0.80–1.40)

## 2015-03-29 ENCOUNTER — Telehealth: Payer: Self-pay | Admitting: Family

## 2015-03-29 NOTE — Assessment & Plan Note (Signed)
Fair control. Management per psychiatry.  

## 2015-03-29 NOTE — Telephone Encounter (Signed)
See my chart message

## 2015-03-29 NOTE — Assessment & Plan Note (Signed)
D/c flonase, start qnasl for insurance purposes.

## 2015-03-29 NOTE — Assessment & Plan Note (Signed)
Stable, d/c omeprazole, rx with aciphex for insurance purposes.

## 2015-04-08 ENCOUNTER — Encounter: Payer: Self-pay | Admitting: Family

## 2015-04-08 ENCOUNTER — Ambulatory Visit (INDEPENDENT_AMBULATORY_CARE_PROVIDER_SITE_OTHER): Payer: Commercial Managed Care - PPO | Admitting: Family

## 2015-04-08 ENCOUNTER — Ambulatory Visit (HOSPITAL_BASED_OUTPATIENT_CLINIC_OR_DEPARTMENT_OTHER)
Admission: RE | Admit: 2015-04-08 | Discharge: 2015-04-08 | Disposition: A | Discharge status: Home / Self Care | Payer: Commercial Managed Care - PPO | Source: Ambulatory Visit | Attending: Family | Admitting: Family

## 2015-04-08 VITALS — HR 72 | Temp 98.4°F | Resp 16 | Ht 60.0 in | Wt 195.8 lb

## 2015-04-08 DIAGNOSIS — M25551 Pain in right hip: Secondary | ICD-10-CM | POA: Diagnosis not present

## 2015-04-08 DIAGNOSIS — M79604 Pain in right leg: Secondary | ICD-10-CM | POA: Diagnosis not present

## 2015-04-08 NOTE — Patient Instructions (Signed)
Complete hip x ray on the first floor.  Continue ibuprofen 400mg  every 6 hours for next few days.  Call if pain worsens or if pain is not improved in 1 week.

## 2015-04-08 NOTE — Progress Notes (Signed)
Subjective:    Patient ID: Rhonda Tate, female    DOB: 09/12/1985, 29 y.o.   MRN: [id number]  HPI  Ms. Rhonda Tate is a 29 yr old female who presents today with chief complaint of right sided hip pain.  She reports that hip pain began on Saturday 10/29 while she was taking out trash.  Developed some right hip pain when she lifts her right hip. Using ibuprofen without improvement. No improvement with a hot shower.  Pain is worse with weight bearing.   Denies previous hx of hip pain/issues.   Review of Systems See HPI  Past Medical History  Diagnosis Date  . Anemia     iron deficiency  . Depression   . Acid reflux   . Bulging disc     slight  . Ear infection   . Heartburn   . Anxiety   . Bipolar disorder (HCC)     year 2009  . Liver disease     Fatty Liver Disease 2012  . Fatigue   . Tachycardia April 27, 2014  . Constipation   . Diarrhea   . Headache   . OSA (obstructive sleep apnea)     mild  . PCOS (polycystic ovarian syndrome)     Social History   Social History  . Marital Status: Single    Spouse Name: N/A  . Number of Children: N/A  . Years of Education: N/A   Occupational History  . Not on file.   Social History Main Topics  . Smoking status: Never Smoker   . Smokeless tobacco: Never Used  . Alcohol Use: Yes     Comment: a few times a year- drink 1-2 drinks per episode  . Drug Use: No  . Sexual Activity: Not on file   Other Topics Concern  . Not on file   Social History Narrative   Has 2 dogs and 2 cats   Junior Account   Completed Bachelors   Single, no children   Lives with animales   Born in [place]   ENjoys museums, historical things, movies, friends    Past Surgical History  Procedure Laterality Date  . Wisdom tooth extraction    . Wisdom tooth extraction    . Colonoscopy    . Upper gi endoscopy    . Cholecystectomy, laparoscopic  07/09/2014  . Cholecystectomy N/A 07/12/2014    Procedure: LAPAROSCOPIC CHOLECYSTECTOMY WITH  INTRAOPERATIVE CHOLANGIOGRAM;  Surgeon: Chevis Pretty III, MD;  Location: [facility] OR;  Service: General;  Laterality: N/A;    Family History  Problem Relation Age of Onset  . Cancer Other     breast/ family hx of  . Diabetes Other     family hx of  . Hyperlipidemia Other     family hx of  . Hypertension Other     family hx of  . Thyroid disease Other     family hx of  . Thyroid disease Mother   . GER disease Mother   . Hyperlipidemia Mother   . Other Mother     uterine polyp  . Hypertension Father   . Hyperlipidemia Father   . Diabetes Father     type II  . Gout Father   . Cancer Maternal Aunt     breast cancer 50's  . Drug abuse Maternal Aunt   . Cancer Paternal Uncle     lung  . Heart attack Maternal Grandmother   . Cancer Paternal Grandmother     breast  70's  . Heart disease Paternal Grandfather     heart failure  . Drug abuse Maternal Uncle   . Drug abuse Cousin   . Alcohol abuse Neg Hx   . Anxiety disorder Neg Hx   . Bipolar disorder Neg Hx   . Depression Neg Hx     Allergies  Allergen Reactions  . Iodinated Diagnostic Agents Other (See Comments)    Numbness on right side of body (only from MRI dye)  . Other Other (See Comments)    Vanilla causes migraines  . Risperidone And Related Other (See Comments)    Uncontrollable twitching  . Trazodone And Nefazodone Diarrhea and Other (See Comments)    Severe abdominal cramping, loss of appetite  . Wellbutrin [Bupropion] Other (See Comments)    Elevated BP, tacycardia, insomnia, no appetite (only when taking phentermine)   . Hydromorphone Hcl Itching, Nausea And Vomiting and Rash  . Sulfa Antibiotics Nausea And Vomiting and Rash  . Tape Itching and Rash    Paper tape ok    Current Outpatient Prescriptions on File Prior to Visit  Medication Sig Dispense Refill  . Beclomethasone Dipropionate (QNASL) 80 MCG/ACT AERS Place 2 sprays into the nose daily. 8.7 g 5  . MONONESSA 0.25-35 MG-MCG tablet Take 1 tablet by mouth  daily.  4  . RABEprazole (ACIPHEX) 20 MG tablet Take 1 tablet (20 mg total) by mouth daily. 30 tablet 5   No current facility-administered medications on file prior to visit.    Pulse 72  Temp(Src) 98.4 F (36.9 C) (Oral)  Resp 16  Ht 5' (1.524 m)  Wt 195 lb 12.8 oz (88.814 kg)  BMI 38.24 kg/m2  SpO2 100%       Objective:   Physical Exam  Constitutional: She appears well-developed and well-nourished.  Cardiovascular: Normal rate, regular rhythm and normal heart sounds.   No murmur heard. Pulmonary/Chest: Effort normal and breath sounds normal. No respiratory distress. She has no wheezes.  Musculoskeletal:  + loud hip click with right hip abduction.  + pain with ROM of the right hip, worse with adduction  Psychiatric: She has a normal mood and affect. Her behavior is normal. Judgment and thought content normal.          Assessment & Plan:  R hip pain-  X ray is performed (Pt declined urine HCG- Reports no chance of pregnancy, no sex x >1 year) X ray is unremarkable. Advised pt to continue prn ibuprofen and will refer to sports medicine- see phone note.

## 2015-04-08 NOTE — Progress Notes (Signed)
Pre visit review using our clinic review tool, if applicable. No additional management support is needed unless otherwise documented below in the visit note. 

## 2015-04-09 ENCOUNTER — Ambulatory Visit (INDEPENDENT_AMBULATORY_CARE_PROVIDER_SITE_OTHER): Payer: Commercial Managed Care - PPO | Admitting: Psychiatry

## 2015-04-09 ENCOUNTER — Telehealth: Payer: Self-pay | Admitting: Family

## 2015-04-09 ENCOUNTER — Encounter (HOSPITAL_COMMUNITY): Payer: Self-pay | Admitting: Psychiatry

## 2015-04-09 VITALS — BP 114/70 | HR 98 | Ht 60.0 in | Wt 195.0 lb

## 2015-04-09 DIAGNOSIS — F331 Major depressive disorder, recurrent, moderate: Secondary | ICD-10-CM | POA: Diagnosis not present

## 2015-04-09 DIAGNOSIS — F410 Panic disorder [episodic paroxysmal anxiety] without agoraphobia: Secondary | ICD-10-CM

## 2015-04-09 DIAGNOSIS — F4010 Social phobia, unspecified: Secondary | ICD-10-CM

## 2015-04-09 DIAGNOSIS — F4310 Post-traumatic stress disorder, unspecified: Secondary | ICD-10-CM | POA: Diagnosis not present

## 2015-04-09 DIAGNOSIS — M25551 Pain in right hip: Secondary | ICD-10-CM

## 2015-04-09 MED ORDER — ESCITALOPRAM OXALATE 20 MG PO TABS: 20.0000 mg | ORAL_TABLET | Freq: Every day | ORAL | Status: DC

## 2015-04-09 MED ORDER — LITHIUM CARBONATE ER 450 MG PO TBCR: 900.0000 mg | EXTENDED_RELEASE_TABLET | Freq: Every day | ORAL | Status: DC

## 2015-04-09 NOTE — Telephone Encounter (Signed)
Please let pt know that her hip x ray is normal.  I would recommend that she see Dr. Pearletha ForgeHudnall, sports medicine.

## 2015-04-09 NOTE — Telephone Encounter (Signed)
Notified pt and she voices understanding. 
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
## 2015-04-11 ENCOUNTER — Ambulatory Visit (INDEPENDENT_AMBULATORY_CARE_PROVIDER_SITE_OTHER): Payer: Commercial Managed Care - PPO | Admitting: Family Medicine

## 2015-04-11 ENCOUNTER — Encounter: Payer: Self-pay | Admitting: Family Medicine

## 2015-04-11 VITALS — BP 126/84 | HR 76 | Ht 60.0 in | Wt 195.0 lb

## 2015-04-11 DIAGNOSIS — M24851 Other specific joint derangements of right hip, not elsewhere classified: Secondary | ICD-10-CM | POA: Diagnosis not present

## 2015-04-11 DIAGNOSIS — M25551 Pain in right hip: Secondary | ICD-10-CM

## 2015-04-11 NOTE — Patient Instructions (Signed)
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 Start physical therapy and do home exercises on days you don't go to therapy - these are the most important parts of treatment. Ibuprofen or aleve as needed for pain. Heat for spasms if needed. Follow up with me in 1 month to 6 weeks for reevaluation. All activities as tolerated.

## 2015-04-15 DIAGNOSIS — M25551 Pain in right hip: Secondary | ICD-10-CM | POA: Insufficient documentation

## 2015-04-15 NOTE — Assessment & Plan Note (Signed)
consistent with snapping hip syndrome though she's had some radiation of this suggesting sciatica posteriorly (exam otherwise normal though).  Start physical therapy for both, home exercises.  Nsaids with heat as needed.  F/u in about 6 weeks for reevaluation.  Activities as tolerated otherwise.

## 2015-04-15 NOTE — Progress Notes (Signed)
PCP and consultation requested by: Lemont Fillers'SULLIVAN,MELISSA S., NP  Subjective:   HPI: Patient is a 29 y.o. female here for right hip pain.  Patient reports she's had anterior and lateral right hip pain since Saturday. No known injury or trauma. Recalls throwing trash onto the back of a truck around the time this started. Has had a popping noise in this area of hip, 5/10 level of pain that is sharp. Worse at night. Tried ibuprofen, heat. No prior issues. Radiographs negative. No skin changes, fever, other complaints.  Past Medical History  Diagnosis Date  . Anemia     iron deficiency  . Depression   . Acid reflux   . Bulging disc     slight  . Ear infection   . Heartburn   . Anxiety   . Bipolar disorder (HCC)     year 2009  . Liver disease     Fatty Liver Disease 2012  . Fatigue   . Tachycardia April 27, 2014  . Constipation   . Diarrhea   . Headache   . OSA (obstructive sleep apnea)     mild  . PCOS (polycystic ovarian syndrome)     Current Outpatient Prescriptions on File Prior to Visit  Medication Sig Dispense Refill  . Beclomethasone Dipropionate (QNASL) 80 MCG/ACT AERS Place 2 sprays into the nose daily. 8.7 g 5  . escitalopram (LEXAPRO) 20 MG tablet Take 1 tablet (20 mg total) by mouth daily. 30 tablet 1  . lithium carbonate (ESKALITH) 450 MG CR tablet Take 2 tablets (900 mg total) by mouth daily. 60 tablet 1  . RABEprazole (ACIPHEX) 20 MG tablet Take 1 tablet (20 mg total) by mouth daily. 30 tablet 5   No current facility-administered medications on file prior to visit.    Past Surgical History  Procedure Laterality Date  . Wisdom tooth extraction    . Wisdom tooth extraction    . Colonoscopy    . Upper gi endoscopy    . Cholecystectomy, laparoscopic  07/09/2014  . Cholecystectomy N/A 07/12/2014    Procedure: LAPAROSCOPIC CHOLECYSTECTOMY WITH INTRAOPERATIVE CHOLANGIOGRAM;  Surgeon: Chevis PrettyPaul Toth III, MD;  Location: [facility] OR;  Service: General;  Laterality: N/A;     Allergies  Allergen Reactions  . Iodinated Diagnostic Agents Other (See Comments)    Numbness on right side of body (only from MRI dye)  . Other Other (See Comments)    Vanilla causes migraines  . Risperidone And Related Other (See Comments)    Uncontrollable twitching  . Trazodone And Nefazodone Diarrhea and Other (See Comments)    Severe abdominal cramping, loss of appetite  . Wellbutrin [Bupropion] Other (See Comments)    Elevated BP, tacycardia, insomnia, no appetite (only when taking phentermine)   . Hydromorphone Hcl Itching, Nausea And Vomiting and Rash  . Sulfa Antibiotics Nausea And Vomiting and Rash  . Tape Itching and Rash    Paper tape ok    Social History   Social History  . Marital Status: Single    Spouse Name: N/A  . Number of Children: N/A  . Years of Education: N/A   Occupational History  . Not on file.   Social History Main Topics  . Smoking status: Never Smoker   . Smokeless tobacco: Never Used  . Alcohol Use: 0.0 oz/week    0 Standard drinks or equivalent per week     Comment: a few times a year- drink 1-2 drinks per episode  . Drug Use: No  . Sexual  Activity: Not on file   Other Topics Concern  . Not on file   Social History Narrative   Has 2 dogs and 2 cats   Junior Account   Completed Bachelors   Single, no children   Lives with animales   Born in [place]   ENjoys museums, historical things, movies, friends    Family History  Problem Relation Age of Onset  . Cancer Other     breast/ family hx of  . Diabetes Other     family hx of  . Hyperlipidemia Other     family hx of  . Hypertension Other     family hx of  . Thyroid disease Other     family hx of  . Thyroid disease Mother   . GER disease Mother   . Hyperlipidemia Mother   . Other Mother     uterine polyp  . Hypertension Father   . Hyperlipidemia Father   . Diabetes Father     type II  . Gout Father   . Cancer Maternal Aunt     breast cancer 50's  . Drug abuse  Maternal Aunt   . Cancer Paternal Uncle     lung  . Heart attack Maternal Grandmother   . Cancer Paternal Grandmother     breast 70's  . Heart disease Paternal Grandfather     heart failure  . Drug abuse Maternal Uncle   . Drug abuse Cousin   . Alcohol abuse Neg Hx   . Anxiety disorder Neg Hx   . Bipolar disorder Neg Hx   . Depression Neg Hx     BP 126/84 mmHg  Pulse 76  Ht 5' (1.524 m)  Wt 195 lb (88.451 kg)  BMI 38.08 kg/m2  Review of Systems: See HPI above.    Objective:  Physical Exam:  Gen: NAD  Back: No gross deformity, scoliosis. No TTP.  No midline or bony TTP. FROM. Strength LEs 5/5 all muscle groups.   Negative SLRs. Sensation intact to light touch bilaterally.  Right hip: No gross deformity, swelling, bruising. TTP anterior hip over hip flexor, less lateral IT band proximally. Negative logroll bilateral hips Negative fabers and piriformis stretches.    Assessment & Plan:  1. Right hip pain - consistent with snapping hip syndrome though she's had some radiation of this suggesting sciatica posteriorly (exam otherwise normal though).  Start physical therapy for both, home exercises.  Nsaids with heat as needed.  F/u in about 6 weeks for reevaluation.  Activities as tolerated otherwise.

## 2015-04-18 ENCOUNTER — Ambulatory Visit
Payer: Commercial Managed Care - PPO | Attending: Family Medicine | Admitting: Rehabilitative and Restorative Service Providers"

## 2015-04-18 DIAGNOSIS — M25551 Pain in right hip: Secondary | ICD-10-CM | POA: Diagnosis not present

## 2015-04-18 DIAGNOSIS — Z7409 Other reduced mobility: Secondary | ICD-10-CM | POA: Insufficient documentation

## 2015-04-18 DIAGNOSIS — M623 Immobility syndrome (paraplegic): Secondary | ICD-10-CM | POA: Insufficient documentation

## 2015-04-18 DIAGNOSIS — M2560 Stiffness of unspecified joint, not elsewhere classified: Secondary | ICD-10-CM

## 2015-04-18 DIAGNOSIS — R6889 Other general symptoms and signs: Secondary | ICD-10-CM

## 2015-04-18 DIAGNOSIS — R531 Weakness: Secondary | ICD-10-CM

## 2015-04-18 DIAGNOSIS — R29898 Other symptoms and signs involving the musculoskeletal system: Secondary | ICD-10-CM | POA: Diagnosis present

## 2015-04-18 NOTE — Therapy (Signed)
[facility] [address]  [address] [place], [place], [address] Phone: [phone]   Fax:  [phone]  Physical Therapy Evaluation  Patient Details  Name: Rhonda SnareJessica E Mcginley MRN: [id number] Date of Birth: 05-07-1986 Referring Provider: Dr. Jamey RipaHundall  Encounter Date: 04/18/2015      PT End of Session - 04/18/15 1242    Visit Number 1   Number of Visits 12   Date for PT Re-Evaluation 05/30/15   PT Start Time 0844   PT Stop Time 0930   PT Time Calculation (min) 46 min   Activity Tolerance Patient tolerated treatment well      Past Medical History  Diagnosis Date  . Anemia     iron deficiency  . Depression   . Acid reflux   . Bulging disc     slight  . Ear infection   . Heartburn   . Anxiety   . Bipolar disorder (HCC)     year 2009  . Liver disease     Fatty Liver Disease 2012  . Fatigue   . Tachycardia April 27, 2014  . Constipation   . Diarrhea   . Headache   . OSA (obstructive sleep apnea)     mild  . PCOS (polycystic ovarian syndrome)     Past Surgical History  Procedure Laterality Date  . Wisdom tooth extraction    . Wisdom tooth extraction    . Colonoscopy    . Upper gi endoscopy    . Cholecystectomy, laparoscopic  07/09/2014  . Cholecystectomy N/A 07/12/2014    Procedure: LAPAROSCOPIC CHOLECYSTECTOMY WITH INTRAOPERATIVE CHOLANGIOGRAM;  Surgeon: Chevis PrettyPaul Toth III, MD;  Location: [facility] OR;  Service: General;  Laterality: N/A;    There were no vitals filed for this visit.  Visit Diagnosis:  Pain, hip, right - Plan: PT plan of care cert/re-cert  Weakness of right hip - Plan: PT plan of care cert/re-cert  Stiffness due to immobility - Plan: PT plan of care cert/re-cert  Decreased strength, endurance, and mobility - Plan: PT plan of care cert/re-cert      Subjective Assessment - 04/18/15 0837    Subjective Pt reports that she was diagnosed with snapping hp and sciatica. She has noticed snapping of  the Rt hip for years but noticed pain in the hip 04/06/15 affer she was climbing into the back of her dd's truck to toss trash off the back of the truck. She has pain with sitting; walking up stairs; bending over; turning in bed   Pertinent History "buldging disc" in neck with occ pain in the Rt neck and UE - rare; Rt knee injury in childhood some arthritic changes   How long can you sit comfortably? 2 hours    How long can you stand comfortably? standing on both feet 15-20 min; standing on Lt without wt on Rt then shifting back to Rt is severe pain in Rt hip    How long can you walk comfortably? 10-15 min    Diagnostic tests xrays (-)   Patient Stated Goals make hip stop hurting    Currently in Pain? Yes   Pain Score 4    Pain Location Hip   Pain Orientation Right   Pain Descriptors / Indicators Sharp;Dull;Aching   Pain Type Acute pain   Pain Radiating Towards into sacrum into buttock; radiatiing pain into the anterior thigh on an intermittent basis   Pain Onset 1 to 4 weeks ago   Pain Frequency Intermittent  Aggravating Factors  sitting; standing; changing positions when in bed; bending    Pain Relieving Factors lying flat; propping leg at work            [facility] PT Assessment - 04/18/15 0001    Assessment   Medical Diagnosis Rt snapping hip/sciatica   Referring Provider Dr. Jamey Ripa   Onset Date/Surgical Date 04/06/15   Hand Dominance Right   Next MD Visit 12/16   Prior Therapy none   Precautions   Precaution Comments avoid activities that hurt    Balance Screen   Has the patient fallen in the past 6 months No   Has the patient had a decrease in activity level because of a fear of falling?  No   Is the patient reluctant to leave their home because of a fear of falling?  No   Home Environment   Additional Comments single level home 2 steps into home no difficulty    Prior Function   Level of Independence Independent   Vocation Full time employment   Vocation Requirements  steps at work ~1 x/day; mostly sitting at [employer]   Leisure sedentary at this time; cares for home and pets;  has done step aerobics in past    Observation/Other Assessments   Focus on Therapeutic Outcomes (FOTO)  38% limitation   Sensation   Additional Comments WFL's per pt report    Posture/Postural Control   Posture Comments head forward; shoulders rounded; increaed thoracic kyphosis; flexed forward at hips; knees hyperextended   AROM   Right/Left Hip --  tight end ranges Rt hip flexion/ext/rot   Lumbar Flexion 95%   of available ROM   Lumbar Extension 45%   Lumbar - Right Side Bend 80%  pain Rt buttock   Lumbar - Left Side Bend 85%   Pain into Rt hip/buttock > Rt SB   Lumbar - Right Rotation 30%   Lumbar - Left Rotation 25%   Strength   Strength Assessment Site --  5/5 bilat LE's except Rt hip flex/ext 5-/5 - painful    Flexibility   Hamstrings Rt 80 deg; Lt 85 deg   Quadriceps Rt heel ~7 in from buttock; Lt 6 in   ITB tight Rt > Lt   Piriformis tight Rt > Lt   Palpation   Spinal mobility CPA L4/5; L5/S1; sacrum   SI assessment  Pain Lt > Rt   Palpation comment tight and tender Rt hip flexors; IT band    Special Tests    Special Tests --  (-) faber; SLR                   [facility] Adult PT Treatment/Exercise - 04/18/15 0001    Lumbar Exercises: Stretches   Press Ups Limitations 10 reps 2-3 sec hold   Lumbar Exercises: Supine   AB Set Limitations 3 part core 10 sec x 10    Knee/Hip Exercises: Stretches   Hip Flexor Stretch 30 seconds;3 reps   Hip Flexor Stretch Limitations supine bilat knees to chest extend Rt LE off table x3; half kneeling x3                 PT Education - 04/18/15 0920    Education provided Yes   Education Details HEP   Person(s) Educated Patient   Methods Explanation;Demonstration;Tactile cues;Verbal cues;Handout   Comprehension Verbalized understanding;Returned demonstration;Verbal cues required;Tactile cues required              PT Long Term Goals -  04/18/15 1248    PT LONG TERM GOAL #3   Title tolerate walking for 30 min without pain 05/30/15   Time 6   Period Weeks   Status New   PT LONG TERM GOAL #4   Title Improve FOTO to </= 23% limitaion 05/30/15   Time 6   Period Weeks   Status New               Plan - 04/18/15 1243    Clinical Impression Statement Pt presents with Rt hip pain and tightness limiting functional activites and rest. She has tightness through hip flexors; limited hip mobility and strength; poor core strength and stability; decresaed functioinal activity level. She will benefit from PT to address problems identified.    Pt will benefit from skilled therapeutic intervention in order to improve on the following deficits Pain;Decreased range of motion;Increased fascial restricitons;Decreased mobility;Decreased strength;Decreased activity tolerance   Rehab Potential Good   PT Frequency 2x / week   PT Duration 6 weeks   PT Treatment/Interventions Patient/family education;ADLs/Self Care Home Management;Therapeutic exercise;Therapeutic activities;Dry needling;Manual techniques;Cryotherapy;Electrical Stimulation;Moist Heat;Ultrasound;Neuromuscular re-education   PT Next Visit Plan progress with core stabilization and LE stretching/strengthening - add quad stretch; hip extension strengthening    PT Home Exercise Plan myofacial ball release and HEP   Consulted and Agree with Plan of Care Patient         Problem List Patient Active Problem List   Diagnosis Date Noted  . Right hip pain 04/15/2015  . Preventative health care 10/30/2014  . MDD (major depressive disorder), recurrent episode, moderate (HCC) 10/04/2014  . Social anxiety disorder 10/04/2014  . Panic disorder without agoraphobia 10/04/2014  . PTSD (post-traumatic stress disorder) 10/04/2014  . Allergic rhinitis 08/15/2014  . Daytime somnolence 08/15/2014  . HSV (herpes simplex virus) infection 06/18/2014  .  RUQ pain 05/11/2014  . Nausea with vomiting 05/11/2014  . Gallbladder sludge 05/11/2014  . Mood disorder (HCC) 03/26/2014  . Insomnia 03/26/2014  . Constipation 10/18/2013  . BMI 35.0-35.9,adult 10/18/2013  . Seasonal allergies 10/18/2013  . Depression 09/22/2013  . Obesity, unspecified 09/22/2013  . Insulin resistance 05/16/2012  . Low testosterone 05/16/2012  . Fatty liver disease, nonalcoholic 08/26/2010  . Gallstones 08/26/2010  . GERD 01/31/2010  . [facility] DEFICIENCY 01/01/2010    Keaton Stirewalt Rober Minion PT, MPH 04/18/2015, 12:52 PM  [facility] [address]  [address] [place], [place], [address] Phone: [phone]   Fax:  [phone]  Name: MASHANDA ISHIBASHI MRN: [id number] Date of Birth: [birth year]

## 2015-04-18 NOTE — Patient Instructions (Signed)
Stretching: Hip Flexor    Kneeling on right knee, slowly push pelvis down while slightly arching back until stretch is felt on front of hip. Hold _30___ seconds. Repeat ___2_ times per set. Do __3-4__ sessions per day.     Quads / HF, Supine    Lie near edge of bed, one leg bent, foot flat on bed. Other leg hanging over edge, relaxed, thigh. Bend hanging knee backward keeping thigh in contact with bed. Hold _30__ seconds.  Repeat _3__ times per session. Do _3-4__ sessions per day.   3 part core 10 sec hold 10 reps several times/day  Ball release work with ~4 in ball through hip area 5 min 2-3 times/day

## 2015-04-24 ENCOUNTER — Ambulatory Visit: Payer: Commercial Managed Care - PPO

## 2015-04-26 ENCOUNTER — Encounter: Payer: Self-pay | Admitting: Internal Medicine

## 2015-04-26 ENCOUNTER — Ambulatory Visit (INDEPENDENT_AMBULATORY_CARE_PROVIDER_SITE_OTHER): Payer: Commercial Managed Care - PPO | Admitting: Internal Medicine

## 2015-04-26 VITALS — BP 126/74 | HR 81 | Ht 60.0 in | Wt 194.0 lb

## 2015-04-26 DIAGNOSIS — G4733 Obstructive sleep apnea (adult) (pediatric): Secondary | ICD-10-CM | POA: Diagnosis not present

## 2015-04-26 NOTE — Patient Instructions (Addendum)
--  CPAP titration study.     Sleep Apnea Sleep apnea is disorder that affects a person's sleep. A person with sleep apnea has abnormal pauses in their breathing when they sleep. It is hard for them to get a good sleep. This makes a person tired during the day. It also can lead to other physical problems. There are three types of sleep apnea. One type is when breathing stops for a short time because your airway is blocked (obstructive sleep apnea). Another type is when the brain sometimes fails to give the normal signal to breathe to the muscles that control your breathing (central sleep apnea). The third type is a combination of the other two types. HOME CARE   Take all medicine as told by your doctor.  Avoid alcohol, calming medicines (sedatives), and depressant drugs.  Try to lose weight if you are overweight. Talk to your doctor about a healthy weight goal.  Your doctor may have you use a device that helps to open your airway. It can help you get the air that you need. It is called a positive airway pressure (PAP) device.   MAKE SURE YOU:   Understand these instructions.  Will watch your condition.  Will get help right away if you are not doing well or get worse.  It may take approximately 1 month for you to get used to wearing her CPAP every night.  Be sure to work with

## 2015-04-26 NOTE — Progress Notes (Signed)
[facility]      Assessment and Plan:  Obstructive sleep apnea. -Shanda BumpsJessica was diagnosed with mild affective sleep apnea on a home sleep study and started to feel better after starting CPAP. However, she is now again having symptoms of having excessive daytime somnolence. We'll obtain her records from [facility] to see what her CPAP setting is at, and we'll send her for CPAP titration study to determine her optimal pressure. -If the she is treated with CPAP at the optimal pressure continues to have daytime sleepiness. We'll consider a MSLT study, versus starting the patient on modafinil for residual daytime sleepiness.  Depression, bipolar disorder. -Patient is currently on lithium, the dose was increased recently uncertain of this could be the cause of her increasing sleepiness.  Excessive daytime sleepiness.  Date: 04/26/2015  MRN# [id number] Rhonda Tate Oct 13, 1985  Referring Physician: Evonnie DawesM. O'Sullivan.   Rhonda Tate is a 29 y.o. old female seen in consultation for chief complaint of:    Chief Complaint  Patient presents with  . SLEEP CONSULT    pt. ref. by dr. Brayton Cavesosullivan. currently on cpap 7 hr. everynight. unsure about pressure. still having daytime sleepiness. WUJ:[id number]:[facility]    HPI:   The patient is a 29 year old female with a history of obstructive sleep apnea, currently on CPAP. 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 She also has a history of depression, bipolar disorder, polycystic ovarian syndrome, PTSD.  She has been very sleepy during the day, she has been told in the past that she snores loudly, she noted that she wakes gasping for air and choking. She noted that she would doze off while sitting at her desk at work. She would doze off while at her brothers house or when a passenger in a car. She has struggled to stay awake while driving.  She underwent a  HST, and was told that she had mild OSA. (this test is not accessible in the computer at this time).  She was then given a CPAP machine, she was given the machine about 6 months ago (via [facility]). She goes to bed at around 11pm and puts it on. Falls asleep quickly, she wakes up 5 to 10 times per night spontaneously.  She has trouble with the mask causing pain, other times due to leaks. Wears the mask until 645 am. On the weekend she sleep until noon.  When wakes on weekday does not feel rested, or on the weekend.  She is no longer snoring with the cpap.  When she first started using CPAP, she felt much better, but now her daytime sleepiness is worsening.  Since starting the CPAP, her dose was increased on lithium.   No sleepwalking. She has migraine headaches.   PMHX:   Past Medical History  Diagnosis Date  . Anemia     iron deficiency  . Depression   . Acid reflux   . Bulging disc     slight  . Ear infection   . Heartburn   . Anxiety   . Bipolar disorder (HCC)     year 2009  . Liver disease     Fatty Liver Disease 2012  . Fatigue   . Tachycardia April 27, 2014  . Constipation   . Diarrhea   . Headache   . OSA (obstructive sleep apnea)     mild  . PCOS (polycystic ovarian syndrome)    Surgical Hx:  Past Surgical History  Procedure Laterality Date  . Wisdom tooth extraction    . Wisdom tooth extraction    . Colonoscopy    . Upper gi endoscopy    . Cholecystectomy, laparoscopic  07/09/2014  . Cholecystectomy N/A 07/12/2014    Procedure: LAPAROSCOPIC CHOLECYSTECTOMY WITH INTRAOPERATIVE CHOLANGIOGRAM;  Surgeon: Chevis Pretty III, MD;  Location: [facility] OR;  Service: General;  Laterality: N/A;   Family Hx:  Family History  Problem Relation Age of Onset  . Cancer Other     breast/ family hx of  . Diabetes Other     family hx of  . Hyperlipidemia Other     family hx of  . Hypertension Other     family hx of  . Thyroid disease Other     family hx of  . Thyroid  disease Mother   . GER disease Mother   . Hyperlipidemia Mother   . Other Mother     uterine polyp  . Hypertension Father   . Hyperlipidemia Father   . Diabetes Father     type II  . Gout Father   . Cancer Maternal Aunt     breast cancer 50's  . Drug abuse Maternal Aunt   . Cancer Paternal Uncle     lung  . Heart attack Maternal Grandmother   . Cancer Paternal Grandmother     breast 70's  . Heart disease Paternal Grandfather     heart failure  . Drug abuse Maternal Uncle   . Drug abuse Cousin   . Alcohol abuse Neg Hx   . Anxiety disorder Neg Hx   . Bipolar disorder Neg Hx   . Depression Neg Hx    Social Hx:   Social History  Substance Use Topics  . Smoking status: Never Smoker   . Smokeless tobacco: Never Used  . Alcohol Use: 0.0 oz/week    0 Standard drinks or equivalent per week     Comment: a few times a year- drink 1-2 drinks per episode   Medication:   Current Outpatient Rx  Name  Route  Sig  Dispense  Refill  . Beclomethasone Dipropionate (QNASL) 80 MCG/ACT AERS   Nasal   Place 2 sprays into the nose daily.   8.7 g   5   . escitalopram (LEXAPRO) 20 MG tablet   Oral   Take 1 tablet (20 mg total) by mouth daily.   30 tablet   1   . lithium carbonate (ESKALITH) 450 MG CR tablet   Oral   Take 2 tablets (900 mg total) by mouth daily.   60 tablet   1   . MICROGESTIN FE 1/20 1-20 MG-MCG tablet                 Dispense as written.   . RABEprazole (ACIPHEX) 20 MG tablet   Oral   Take 1 tablet (20 mg total) by mouth daily.   30 tablet   5       Allergies:  Iodinated diagnostic agents; Other; Risperidone and related; Trazodone and nefazodone; Wellbutrin; Hydromorphone hcl; Sulfa antibiotics; and Tape  Review of Systems: Gen:  Denies  fever, sweats, chills HEENT: Denies blurred vision, double vision.  Cvc:  No dizziness, chest pain. Resp:   Denies cough or sputum porduction,  Gi: Denies swallowing difficulty, stomach pain. Gu:  Denies  bladder incontinence, burning urine Ext:   No Joint pain, stiffness. Skin: No skin rash,  hives Endoc:  No polyuria, polydipsia. Psych:  No depression, insomnia. Other:  All other systems were reviewed with the patient and were negative other that what is mentioned in the HPI.   Physical Examination:   VS: There were no vitals taken for this visit.  General Appearance: No distress  Neuro:without focal findings,  speech normal,  HEENT: PERRLA, EOM intact.   Pulmonary: normal breath sounds, No wheezing.  CardiovascularNormal S1,S2.  No m/r/g.   Abdomen: Benign, Soft, non-tender. Renal:  No costovertebral tenderness  GU:  No performed at this time. Endoc: No evident thyromegaly, no signs of acromegaly. Skin:   warm, no rashes, no ecchymosis  Extremities: normal, no cyanosis, clubbing.  Other findings:    LABORATORY PANEL:   CBC No results for input(s): WBC, HGB, HCT, PLT in the last 168 hours. ------------------------------------------------------------------------------------------------------------------  Chemistries  No results for input(s): NA, K, CL, CO2, GLUCOSE, BUN, CREATININE, CALCIUM, MG, AST, ALT, ALKPHOS, BILITOT in the last 168 hours.  Invalid input(s): GFRCGP ------------------------------------------------------------------------------------------------------------------  Cardiac Enzymes No results for input(s): TROPONINI in the last 168 hours. ------------------------------------------------------------  RADIOLOGY:  No results found.     Thank  you for the consultation and for allowing [facility] to assist in the care of your patient. Our recommendations are noted above.  Please contact us if we can be of further service.   Wells Guiles, MD.  Board Certified in Internal Medicine, Pulmonary Medicine, Critical Care Medicine, and Sleep Medicine.  [facility] Office Number: [phone]  Santiago Glad,  M.D.  Stephanie Acre, M.D.  Billy Fischer, M.D

## 2015-04-30 ENCOUNTER — Encounter: Payer: Self-pay | Admitting: Family

## 2015-04-30 ENCOUNTER — Ambulatory Visit: Payer: Commercial Managed Care - PPO | Admitting: Physical Therapy

## 2015-04-30 DIAGNOSIS — M2560 Stiffness of unspecified joint, not elsewhere classified: Secondary | ICD-10-CM

## 2015-04-30 DIAGNOSIS — R6889 Other general symptoms and signs: Secondary | ICD-10-CM

## 2015-04-30 DIAGNOSIS — Z7409 Other reduced mobility: Secondary | ICD-10-CM

## 2015-04-30 DIAGNOSIS — M25551 Pain in right hip: Secondary | ICD-10-CM

## 2015-04-30 DIAGNOSIS — R531 Weakness: Secondary | ICD-10-CM

## 2015-04-30 DIAGNOSIS — R29898 Other symptoms and signs involving the musculoskeletal system: Secondary | ICD-10-CM

## 2015-04-30 NOTE — Therapy (Signed)
[facility] [address]  [address] [place], [place], [address] Phone: [phone]   Fax:  [phone]  Physical Therapy Treatment  Patient Details  Name: Rhonda Tate MRN: [id number] Date of Birth: 1986-05-29 Referring Provider: Dr. Jamey Ripa  Encounter Date: 04/30/2015      PT End of Session - 04/30/15 1634    Visit Number 2   Number of Visits 12   Date for PT Re-Evaluation 05/30/15   PT Start Time 1618   PT Stop Time 1703   PT Time Calculation (min) 45 min   Activity Tolerance Patient tolerated treatment well   Behavior During Therapy [facility] for tasks assessed/performed      Past Medical History  Diagnosis Date  . Anemia     iron deficiency  . Depression   . Acid reflux   . Bulging disc     slight  . Ear infection   . Heartburn   . Anxiety   . Bipolar disorder (HCC)     year 2009  . Liver disease     Fatty Liver Disease 2012  . Fatigue   . Tachycardia April 27, 2014  . Constipation   . Diarrhea   . Headache   . OSA (obstructive sleep apnea)     mild  . PCOS (polycystic ovarian syndrome)     Past Surgical History  Procedure Laterality Date  . Wisdom tooth extraction    . Wisdom tooth extraction    . Colonoscopy    . Upper gi endoscopy    . Cholecystectomy, laparoscopic  07/09/2014  . Cholecystectomy N/A 07/12/2014    Procedure: LAPAROSCOPIC CHOLECYSTECTOMY WITH INTRAOPERATIVE CHOLANGIOGRAM;  Surgeon: Chevis Pretty III, MD;  Location: [facility] OR;  Service: General;  Laterality: N/A;    There were no vitals filed for this visit.  Visit Diagnosis:  Pain, hip, right  Weakness of right hip  Stiffness due to immobility  Decreased strength, endurance, and mobility      Subjective Assessment - 04/30/15 1621    Subjective Patient reports completing home stretches but does not seem to feel that it is helping. Reports work did provide her with a foot rest so that her feet to not dangle at her desk. Pain  not too bad today, but was really bad on Saturday (up to 8/10 sharp, throbbing). States sometimes the pain will bother her when she sleeps on either side.   Currently in Pain? Yes   Pain Score 3    Pain Location Hip   Pain Orientation Right;Anterior   Pain Descriptors / Indicators Dull;Aching                  [facility] Adult PT Treatment/Exercise - 04/30/15 1618    Exercises   Exercises --   Lumbar Exercises: Supine   Bent Knee Raise 10 reps;3 seconds   Bent Knee Raise Limitations TrA + alternating march with green TB around knees   Lumbar Exercises: Quadruped   Single Arm Raise Right;Left;10 reps;3 seconds   Straight Leg Raise 10 reps;3 seconds   Knee/Hip Exercises: Stretches   Passive Hamstring Stretch Right;30 seconds;3 reps   Passive Hamstring Stretch Limitations supine with strap   Quad Stretch Right;30 seconds;3 reps   Quad Stretch Limitations prone with strap lifting hip into extension   Hip Flexor Stretch Right;30 seconds;3 reps   Hip Flexor Stretch Limitations supine bilat knees to chest extend Rt LE off table x3 (patient only able to achieve stretch with  manual overpressure into hip ext & knee flexion); half kneeling x3    ITB Stretch Right;30 seconds;1 rep   ITB Stretch Limitations supine with strap - deferred after 1 rep d/t pain   Other Knee/Hip Stretches Foam roller ITB stretch (limited tolerance d/t pain)   Knee/Hip Exercises: Supine   Bridges Both;10 reps;2 sets   Bridges Limitations TrA   Bridges with Clamshell Both;10 reps  Alt SL clam with green TB   Other Supine Knee/Hip Exercises Alt SL Hip Abd/ER clam with green TB 2x10   Knee/Hip Exercises: Sidelying   Clams Rt hip Abd/ER clam with green TB 2x10 in Lt sidelying                PT Education - 04/30/15 1804    Education provided Yes   Education Details Addition to HEP, Use of pillow between legs for side-sleeping   Person(s) Educated Patient   Methods Explanation;Demonstration;Handout    Comprehension Verbalized understanding;Returned demonstration             PT Long Term Goals - 04/30/15 1815    PT LONG TERM GOAL #1   Title Improve flexibility through hip flexors allowing patient to achieve full active hip extension 05/30/15   Status On-going   PT LONG TERM GOAL #2   Title 5/5 painfree hip strenght 05/30/15   Status On-going   PT LONG TERM GOAL #3   Title tolerate walking for 30 min without pain 05/30/15   Status On-going   PT LONG TERM GOAL #4   Title Improve FOTO to </= 23% limitation 05/30/15               Plan - 04/30/15 1812    Clinical Impression Statement Patient reporting good compliance with HEP but reporting limited effectiveness with modified thomas stretch for right hip flexors. Able to achieve effective stretch only with manual overpressure into hip extension and knee flexion. Added hamstring stretch (already completing quad stretch) and attempted ITB stretch but deferred due to increased pain. May benefit from manual therapy to address ITB tightness. Initiated core and hip strengthening with good patient tolerance except instabilty with quadruped exercises. HEP updated to include some of core/hip strengthening exercises.   PT Next Visit Plan progress with core stabilization and LE stretching/strengthening, manual therapy/STM to right ITB   Consulted and Agree with Plan of Care Patient        Problem List Patient Active Problem List   Diagnosis Date Noted  . Right hip pain 04/15/2015  . Preventative health care 10/30/2014  . MDD (major depressive disorder), recurrent episode, moderate (HCC) 10/04/2014  . Social anxiety disorder 10/04/2014  . Panic disorder without agoraphobia 10/04/2014  . PTSD (post-traumatic stress disorder) 10/04/2014  . Allergic rhinitis 08/15/2014  . Daytime somnolence 08/15/2014  . HSV (herpes simplex virus) infection 06/18/2014  . RUQ pain 05/11/2014  . Nausea with vomiting 05/11/2014  . Gallbladder sludge  05/11/2014  . Mood disorder (HCC) 03/26/2014  . Insomnia 03/26/2014  . Constipation 10/18/2013  . BMI 35.0-35.9,adult 10/18/2013  . Seasonal allergies 10/18/2013  . Depression 09/22/2013  . Obesity, unspecified 09/22/2013  . Insulin resistance 05/16/2012  . Low testosterone 05/16/2012  . Fatty liver disease, nonalcoholic 08/26/2010  . Gallstones 08/26/2010  . GERD 01/31/2010  . [facility] DEFICIENCY 01/01/2010    Marry Guan, PT, MPT 04/30/2015, 6:22 PM  [facility] [address]  [address] [place], [place], [address] Phone: [phone]  Fax:  [phone]  Name: Rhonda Tate MRN: [id number] Date of Birth: August 23, 1985

## 2015-05-06 ENCOUNTER — Ambulatory Visit: Payer: Commercial Managed Care - PPO | Admitting: Physical Therapy

## 2015-05-06 DIAGNOSIS — Z7409 Other reduced mobility: Secondary | ICD-10-CM

## 2015-05-06 DIAGNOSIS — M25551 Pain in right hip: Secondary | ICD-10-CM | POA: Diagnosis not present

## 2015-05-06 DIAGNOSIS — R531 Weakness: Secondary | ICD-10-CM

## 2015-05-06 DIAGNOSIS — R29898 Other symptoms and signs involving the musculoskeletal system: Secondary | ICD-10-CM

## 2015-05-06 DIAGNOSIS — M2560 Stiffness of unspecified joint, not elsewhere classified: Secondary | ICD-10-CM

## 2015-05-06 NOTE — Therapy (Signed)
[facility] [place] [address]  [address] [place], [place], [address] Phone: [phone]   Fax:  [phone]  Physical Therapy Treatment  Patient Details  Name: Rhonda SnareJessica E Tate MRN: [id number] Date of Birth: 1985/07/17 Referring Provider: Dr. Jamey RipaHundall  Encounter Date: 05/06/2015      PT End of Session - 05/06/15 1541    Visit Number 3   Number of Visits 12   Date for PT Re-Evaluation 05/30/15   PT Start Time 1535   PT Stop Time 1614   PT Time Calculation (min) 39 min   Activity Tolerance Patient tolerated treatment well   Behavior During Therapy [facility] for tasks assessed/performed      Past Medical History  Diagnosis Date  . Anemia     iron deficiency  . Depression   . Acid reflux   . Bulging disc     slight  . Ear infection   . Heartburn   . Anxiety   . Bipolar disorder (HCC)     year 2009  . Liver disease     Fatty Liver Disease 2012  . Fatigue   . Tachycardia April 27, 2014  . Constipation   . Diarrhea   . Headache   . OSA (obstructive sleep apnea)     mild  . PCOS (polycystic ovarian syndrome)     Past Surgical History  Procedure Laterality Date  . Wisdom tooth extraction    . Wisdom tooth extraction    . Colonoscopy    . Upper gi endoscopy    . Cholecystectomy, laparoscopic  07/09/2014  . Cholecystectomy N/A 07/12/2014    Procedure: LAPAROSCOPIC CHOLECYSTECTOMY WITH INTRAOPERATIVE CHOLANGIOGRAM;  Surgeon: Chevis PrettyPaul Toth III, MD;  Location: [facility] OR;  Service: General;  Laterality: N/A;    There were no vitals filed for this visit.  Visit Diagnosis:  Pain, hip, right  Weakness of right hip  Stiffness due to immobility  Decreased strength, endurance, and mobility      Subjective Assessment - 05/06/15 1537    Subjective Patient reports increased pain after last visit up to 10/10 at times and lasting x3 days with pain also extending in to left hip and down left leg. Patient feels pain may have  originated from ITB stretch. Did not try any of new HEP exercises d/t increased pain. Pain now localized to "tailbone" and right hip today.   Currently in Pain? Yes   Pain Score 5    Pain Location Sacrum   Pain Orientation Right                  [facility] Adult PT Treatment/Exercise - 05/06/15 1535    Lumbar Exercises: Stretches   Press Ups 3 reps;20 seconds   Lumbar Exercises: Supine   Bent Knee Raise 10 reps;3 seconds  each   Bent Knee Raise Limitations TrA + alternating march x10; TrA + alternating double knee raise   Knee/Hip Exercises: Stretches   Passive Hamstring Stretch Right;30 seconds;3 reps   Passive Hamstring Stretch Limitations supine with strap   Quad Stretch Right;30 seconds;3 reps   Quad Stretch Limitations prone with strap lifting hip into extension   Hip Flexor Stretch Right;30 seconds;3 reps   Hip Flexor Stretch Limitations supine bilat knees to chest extend Rt LE off table x3 (patient only able to achieve stretch with manual overpressure into hip ext & knee flexion); half kneeling x3    ITB Stretch Right;30 seconds   ITB Stretch Limitations manual in  left sidelying   Knee/Hip Exercises: Supine   Bridges Both;10 reps;2 sets   Bridges Limitations TrA   Other Supine Knee/Hip Exercises Alt SL Hip Abd/ER clam with green TB x8 - discontinued secondary to c/o increased left hip pain   Knee/Hip Exercises: Sidelying   Clams Rt hip Abd/ER clam with green TB 2x10 in Lt sidelying                PT Long Term Goals - 04/30/15 1815    PT LONG TERM GOAL #1   Title Improve flexibility through hip flexors allowing patient to achieve full active hip extension 05/30/15   Status On-going   PT LONG TERM GOAL #2   Title 5/5 painfree hip strenght 05/30/15   Status On-going   PT LONG TERM GOAL #3   Title tolerate walking for 30 min without pain 05/30/15   Status On-going   PT LONG TERM GOAL #4   Title Improve FOTO to </= 23% limitation 05/30/15                Plan - 05/06/15 1615    Clinical Impression Statement No progression of exercises today secondary to increased pain after last visit. Current exercises performed without pain today with exception of supine alternating clams deferred secondary to c/o left sided pain. Patient reporting no increased pain at end of session today. Instructed to try new HEP exercises from last visit if pain remains stable or decreased after today's visit, excluding supine alternating clams.   PT Next Visit Plan progress with core stabilization and LE stretching/strengthening, manual therapy/STM to right ITB   Consulted and Agree with Plan of Care Patient        Problem List Patient Active Problem List   Diagnosis Date Noted  . Right hip pain 04/15/2015  . Preventative health care 10/30/2014  . MDD (major depressive disorder), recurrent episode, moderate (HCC) 10/04/2014  . Social anxiety disorder 10/04/2014  . Panic disorder without agoraphobia 10/04/2014  . PTSD (post-traumatic stress disorder) 10/04/2014  . Allergic rhinitis 08/15/2014  . Daytime somnolence 08/15/2014  . HSV (herpes simplex virus) infection 06/18/2014  . RUQ pain 05/11/2014  . Nausea with vomiting 05/11/2014  . Gallbladder sludge 05/11/2014  . Mood disorder (HCC) 03/26/2014  . Insomnia 03/26/2014  . Constipation 10/18/2013  . BMI 35.0-35.9,adult 10/18/2013  . Seasonal allergies 10/18/2013  . Depression 09/22/2013  . Obesity, unspecified 09/22/2013  . Insulin resistance 05/16/2012  . Low testosterone 05/16/2012  . Fatty liver disease, nonalcoholic 08/26/2010  . Gallstones 08/26/2010  . GERD 01/31/2010  . [facility] DEFICIENCY 01/01/2010    Marry Guan, PT, MPT 05/06/2015, 5:06 PM  [facility] [address]  [address] [place], [place], [address] Phone: [phone]   Fax:  [phone]  Name: Rhonda Tate MRN: [id number] Date of Birth: 1985/12/14

## 2015-05-09 ENCOUNTER — Ambulatory Visit: Payer: Commercial Managed Care - PPO | Attending: Family Medicine | Admitting: Physical Therapy

## 2015-05-09 DIAGNOSIS — Z7409 Other reduced mobility: Secondary | ICD-10-CM | POA: Insufficient documentation

## 2015-05-09 DIAGNOSIS — R531 Weakness: Secondary | ICD-10-CM

## 2015-05-09 DIAGNOSIS — M623 Immobility syndrome (paraplegic): Secondary | ICD-10-CM | POA: Insufficient documentation

## 2015-05-09 DIAGNOSIS — M25551 Pain in right hip: Secondary | ICD-10-CM | POA: Diagnosis not present

## 2015-05-09 DIAGNOSIS — R29898 Other symptoms and signs involving the musculoskeletal system: Secondary | ICD-10-CM | POA: Insufficient documentation

## 2015-05-09 DIAGNOSIS — M2560 Stiffness of unspecified joint, not elsewhere classified: Secondary | ICD-10-CM

## 2015-05-09 NOTE — Therapy (Signed)
[facility] [place] [address]  [address] [place], [place], [address] Phone: [phone]   Fax:  [phone]  Physical Therapy Treatment  Patient Details  Name: Rhonda SnareJessica E Tate MRN: [id number] Date of Birth: [birth year] Referring Provider: Dr. Jamey RipaHundall  Encounter Date: 05/09/2015      PT End of Session - 05/09/15 1554    Visit Number 4   Number of Visits 12   Date for PT Re-Evaluation 05/30/15   PT Start Time 1536   PT Stop Time 1635   PT Time Calculation (min) 59 min   Activity Tolerance Patient limited by pain   Behavior [facility] Therapy [facility] for tasks assessed/performed      Past Medical History  Diagnosis Date  . Anemia     iron deficiency  . Depression   . Acid reflux   . Bulging disc     slight  . Ear infection   . Heartburn   . Anxiety   . Bipolar disorder (HCC)     year 2009  . Liver disease     Fatty Liver Disease 2012  . Fatigue   . Tachycardia April 27, 2014  . Constipation   . Diarrhea   . Headache   . OSA (obstructive sleep apnea)     mild  . PCOS (polycystic ovarian syndrome)     Past Surgical History  Procedure Laterality Date  . Wisdom tooth extraction    . Wisdom tooth extraction    . Colonoscopy    . Upper gi endoscopy    . Cholecystectomy, laparoscopic  07/09/2014  . Cholecystectomy N/A 07/12/2014    Procedure: LAPAROSCOPIC CHOLECYSTECTOMY WITH INTRAOPERATIVE CHOLANGIOGRAM;  Surgeon: Chevis PrettyPaul Toth III, MD;  Location: [facility] OR;  Service: General;  Laterality: N/A;    There were no vitals filed for this visit.  Visit Diagnosis:  Pain, hip, right  Weakness of right hip  Stiffness due to immobility  Decreased strength, endurance, and mobility      Subjective Assessment - 05/09/15 1552    Subjective Patient reports hip still snaps but pain now more midline along "tailbone", with pain most prominent when changing position from supine to sitting to standing.   Currently in Pain? Yes   Pain  Score 7    Pain Location Sacrum   Pain Orientation Mid                         [facility] Adult PT Treatment/Exercise - 05/09/15 1536    Lumbar Exercises: Supine   Bent Knee Raise 15 reps;3 seconds  each   Bent Knee Raise Limitations TrA + alternating march with green TB x15; TrA + alternating double knee raise   Lumbar Exercises: Quadruped   [place] 10 reps   Opposite Arm/Leg Raise Right arm/Left leg;Left arm/Right leg;10 reps;3 seconds   Knee/Hip Exercises: Stretches   Piriformis Stretch Both;3 reps;20 seconds   Piriformis Stretch Limitations figure 4   Knee/Hip Exercises: Supine   Bridges Both;10 reps;2 sets   Bridges Limitations TrA + hip ABD isometric with green TB   Modalities   Modalities Electrical Stimulation;Moist Heat   Moist Heat Therapy   Number Minutes Moist Heat 15 Minutes   Moist Heat Location Lumbar Spine  Sacrum   Electrical Stimulation   Electrical Stimulation Location Sacrum/piriforms, bilateral   Electrical Stimulation Action IFC   Electrical Stimulation Parameters 40% scan, 80-150Hz , intensity to patient tolerance x15'   Electrical Stimulation Goals Pain  Manual Therapy   Manual Therapy Soft tissue mobilization   Soft tissue mobilization STM & DTM to bilateral piriformis                     PT Long Term Goals - 05/09/15 1848    PT LONG TERM GOAL #1   Title Improve flexibility through hip flexors allowing patient to achieve full active hip extension 05/30/15   Status On-going   PT LONG TERM GOAL #2   Title 5/5 painfree hip strength 05/30/15   Status On-going   PT LONG TERM GOAL #3   Title tolerate walking for 30 min without pain 05/30/15   Status On-going   PT LONG TERM GOAL #4   Title Improve FOTO to </= 23% limitation 05/30/15   Status On-going               Plan - 05/09/15 1841    Clinical Impression Statement Pain appears to be centralizing over sacrum with no lateral hip or sciatic pain today but  remains with high intensity. Pain worst with transitions supine to sit to stand. Focus of therapy today was on manual therapy and estim to reduce patient's pain with only painfree exercises performed.   PT Next Visit Plan Re-assess pain and function, progress with core stabilization and LE stretching/strengthening per pain response, manual therapy & modalities PRN   Consulted and Agree with Plan of Care Patient        Problem List Patient Active Problem List   Diagnosis Date Noted  . Right hip pain 04/15/2015  . Preventative health care 10/30/2014  . MDD (major depressive disorder), recurrent episode, moderate (HCC) 10/04/2014  . Social anxiety disorder 10/04/2014  . Panic disorder without agoraphobia 10/04/2014  . PTSD (post-traumatic stress disorder) 10/04/2014  . Allergic rhinitis 08/15/2014  . Daytime somnolence 08/15/2014  . HSV (herpes simplex virus) infection 06/18/2014  . RUQ pain 05/11/2014  . Nausea with vomiting 05/11/2014  . Gallbladder sludge 05/11/2014  . Mood disorder (HCC) 03/26/2014  . Insomnia 03/26/2014  . Constipation 10/18/2013  . BMI 35.0-35.9,adult 10/18/2013  . Seasonal allergies 10/18/2013  . Depression 09/22/2013  . Obesity, unspecified 09/22/2013  . Insulin resistance 05/16/2012  . Low testosterone 05/16/2012  . Fatty liver disease, nonalcoholic 08/26/2010  . Gallstones 08/26/2010  . GERD 01/31/2010  . [facility] DEFICIENCY 01/01/2010    Marry Guan, PT, MPT 05/09/2015, 6:50 PM  [facility] [address]  [address] [place], [place], [address] Phone: [phone]   Fax:  [phone]  Name: Rhonda Tate MRN: [id number] Date of Birth: 09/14/1985

## 2015-05-13 ENCOUNTER — Ambulatory Visit: Payer: Commercial Managed Care - PPO | Admitting: Physical Therapy

## 2015-05-13 DIAGNOSIS — Z7409 Other reduced mobility: Secondary | ICD-10-CM

## 2015-05-13 DIAGNOSIS — M2560 Stiffness of unspecified joint, not elsewhere classified: Secondary | ICD-10-CM

## 2015-05-13 DIAGNOSIS — R29898 Other symptoms and signs involving the musculoskeletal system: Secondary | ICD-10-CM

## 2015-05-13 DIAGNOSIS — M25551 Pain in right hip: Secondary | ICD-10-CM | POA: Diagnosis not present

## 2015-05-13 DIAGNOSIS — R531 Weakness: Secondary | ICD-10-CM

## 2015-05-13 NOTE — Therapy (Signed)
[facility] [address]  [address] [place], [place], [address] Phone: [phone]   Fax:  [phone]  Physical Therapy Treatment  Patient Details  Name: Rhonda Tate MRN: [id number] Date of Birth: [birth year] Referring Provider: Dr. Jamey Ripa  Encounter Date: 05/13/2015      PT End of Session - 05/13/15 1546    Visit Number 5   Number of Visits 12   Date for PT Re-Evaluation 05/30/15   PT Start Time 1534   PT Stop Time 1632   PT Time Calculation (min) 58 min   Activity Tolerance Patient tolerated treatment well;Patient limited by pain   Behavior During Therapy [facility] for tasks assessed/performed      Past Medical History  Diagnosis Date  . Anemia     iron deficiency  . Depression   . Acid reflux   . Bulging disc     slight  . Ear infection   . Heartburn   . Anxiety   . Bipolar disorder (HCC)     year 2009  . Liver disease     Fatty Liver Disease 2012  . Fatigue   . Tachycardia April 27, 2014  . Constipation   . Diarrhea   . Headache   . OSA (obstructive sleep apnea)     mild  . PCOS (polycystic ovarian syndrome)     Past Surgical History  Procedure Laterality Date  . Wisdom tooth extraction    . Wisdom tooth extraction    . Colonoscopy    . Upper gi endoscopy    . Cholecystectomy, laparoscopic  07/09/2014  . Cholecystectomy N/A 07/12/2014    Procedure: LAPAROSCOPIC CHOLECYSTECTOMY WITH INTRAOPERATIVE CHOLANGIOGRAM;  Surgeon: Chevis Pretty III, MD;  [facility] OR;  Service: General;  Laterality: N/A;    There were no vitals filed for this visit.  Visit Diagnosis:  Pain, hip, right  Weakness of right hip  Stiffness due to immobility  Decreased strength, endurance, and mobility      Subjective Assessment - 05/13/15 1541    Subjective Patient reports she was pain-free from last visit until late yesterday evening, when pain returned as she stood up from the sofa. Pain has steadily increased  since last night with no relief from Ibuprofen, but slight improvement with heated seat in car.   Currently in Pain? Yes   Pain Score 8    Pain Location Sacrum   Pain Orientation Mid;Right;Anterior   Pain Descriptors / Indicators Sharp;Stabbing;Dull;Aching  Sharp, stabbing pain during transitions; otherwise dull ache                         [facility] Adult PT Treatment/Exercise - 05/13/15 1534    Lumbar Exercises: Stretches   Lower Trunk Rotation 10 seconds  10 reps   Lumbar Exercises: Supine   Bent Knee Raise 15 reps;3 seconds  each   Bent Knee Raise Limitations TrA + alternating march with green TB x15; TrA + alternating double knee raise   Lumbar Exercises: Quadruped   Madcat/Old Horse 15 reps   Knee/Hip Exercises: Stretches   Piriformis Stretch Both;3 reps;20 seconds   Piriformis Stretch Limitations figure 4   Knee/Hip Exercises: Supine   Bridges Both;10 reps;2 sets   Bridges Limitations TrA + hip ABD isometric with green TB   Modalities   Modalities Electrical Stimulation;Moist Heat   Moist Heat Therapy   Number Minutes Moist Heat 15 Minutes   Moist Heat Location Lumbar  Spine  Sacrum   Programme researcher, broadcasting/film/videolectrical Stimulation   Electrical Stimulation Location Channel 1 - Sacrum; Channel 2 - Rt piriformis   Electrical Stimulation Action Pre-mod   Electrical Stimulation Parameters 10-20Hz , intensity to patient tolerance x15"   Electrical Stimulation Goals Pain   Manual Therapy   Manual Therapy Soft tissue mobilization   Soft tissue mobilization STM & DTM to right piriformis                     PT Long Term Goals - 05/09/15 1848    PT LONG TERM GOAL #1   Title Improve flexibility through hip flexors allowing patient to achieve full active hip extension 05/30/15   Status On-going   PT LONG TERM GOAL #2   Title 5/5 painfree hip strength 05/30/15   Status On-going   PT LONG TERM GOAL #3   Title tolerate walking for 30 min without pain 05/30/15   Status  On-going   PT LONG TERM GOAL #4   Title Improve FOTO to </= 23% limitation 05/30/15   Status On-going               Plan - 05/13/15 1700    Clinical Impression Statement Patient reporting good relief after last visit lasting several days with pain not returning until last night but intensifying as the day progressed today. Pain remains localized over sacrum but also radiating into right buttock and around to anterior hip. Piriformis stretch and LTR rotation added to HEP with continued emphasis on manual  therapy to relax muscles and reduce pain. Estim applied at end of session with one channel over sacrum and one over right piriformis with patinet reporting pain improved by end of session.   PT Next Visit Plan Re-assess pain and function, progress with core stabilization and LE stretching/strengthening per pain response, manual therapy & modalities PRN   Consulted and Agree with Plan of Care Patient        Problem List Patient Active Problem List   Diagnosis Date Noted  . Right hip pain 04/15/2015  . Preventative health care 10/30/2014  . MDD (major depressive disorder), recurrent episode, moderate (HCC) 10/04/2014  . Social anxiety disorder 10/04/2014  . Panic disorder without agoraphobia 10/04/2014  . PTSD (post-traumatic stress disorder) 10/04/2014  . Allergic rhinitis 08/15/2014  . Daytime somnolence 08/15/2014  . HSV (herpes simplex virus) infection 06/18/2014  . RUQ pain 05/11/2014  . Nausea with vomiting 05/11/2014  . Gallbladder sludge 05/11/2014  . Mood disorder (HCC) 03/26/2014  . Insomnia 03/26/2014  . Constipation 10/18/2013  . BMI 35.0-35.9,adult 10/18/2013  . Seasonal allergies 10/18/2013  . Depression 09/22/2013  . Obesity, unspecified 09/22/2013  . Insulin resistance 05/16/2012  . Low testosterone 05/16/2012  . Fatty liver disease, nonalcoholic 08/26/2010  . Gallstones 08/26/2010  . GERD 01/31/2010  . [facility] 01/[facility]    Marry GuanJoAnne M  Temeca Somma, PT, MPT 05/13/2015, 5:12 PM  [facility] [address]  [address] [place], [place], [address] Phone: [phone]   Fax:  [phone]  Name: Servando SnareJessica E Bo MRN: [id number] Date of Birth: [birth year]

## 2015-05-16 ENCOUNTER — Encounter: Payer: Self-pay | Admitting: Family Medicine

## 2015-05-16 ENCOUNTER — Ambulatory Visit (INDEPENDENT_AMBULATORY_CARE_PROVIDER_SITE_OTHER): Payer: Commercial Managed Care - PPO | Admitting: Family Medicine

## 2015-05-16 ENCOUNTER — Ambulatory Visit: Payer: Commercial Managed Care - PPO | Admitting: Physical Therapy

## 2015-05-16 VITALS — BP 119/82 | HR 79 | Ht 60.0 in | Wt 190.0 lb

## 2015-05-16 DIAGNOSIS — M25551 Pain in right hip: Secondary | ICD-10-CM | POA: Diagnosis not present

## 2015-05-16 DIAGNOSIS — Z7409 Other reduced mobility: Secondary | ICD-10-CM

## 2015-05-16 DIAGNOSIS — R29898 Other symptoms and signs involving the musculoskeletal system: Secondary | ICD-10-CM

## 2015-05-16 DIAGNOSIS — M2560 Stiffness of unspecified joint, not elsewhere classified: Secondary | ICD-10-CM

## 2015-05-16 DIAGNOSIS — R531 Weakness: Secondary | ICD-10-CM

## 2015-05-16 DIAGNOSIS — R6889 Other general symptoms and signs: Secondary | ICD-10-CM

## 2015-05-16 NOTE — Patient Instructions (Signed)
Transition from physical therapy to home exercise program (after you finish your currently scheduled visits). Call or follow up with me in 6 weeks if you're still having trouble. Next step would be to go ahead with an MRI

## 2015-05-16 NOTE — Therapy (Signed)
[facility] [address]  [place], [place], [address] Phone: [phone]   Fax:  [phone]  Physical Therapy Treatment  Patient Details  Name: Rhonda Tate MRN: [id number] Date of Birth: February 15, 1986 Referring Provider: Dr. Ned Clines  Encounter Date: 05/16/2015      PT End of Session - 05/16/15 0802    Visit Number 6   Number of Visits 12   Date for PT Re-Evaluation 05/30/15   PT Start Time 0759   PT Stop Time 0840   PT Time Calculation (min) 41 min   Activity Tolerance Patient tolerated treatment well   Behavior During Therapy [facility] for tasks assessed/performed      Past Medical History  Diagnosis Date  . Anemia     iron deficiency  . Depression   . Acid reflux   . Bulging disc     slight  . Ear infection   . Heartburn   . Anxiety   . Bipolar disorder ([place])     year 2009  . Liver disease     Fatty Liver Disease 2012  . Fatigue   . Tachycardia April 27, 2014  . Constipation   . Diarrhea   . Headache   . OSA (obstructive sleep apnea)     mild  . PCOS (polycystic ovarian syndrome)     Past Surgical History  Procedure Laterality Date  . Wisdom tooth extraction    . Wisdom tooth extraction    . Colonoscopy    . Upper gi endoscopy    . Cholecystectomy, laparoscopic  07/09/2014  . Cholecystectomy N/A 07/12/2014    Procedure: LAPAROSCOPIC CHOLECYSTECTOMY WITH INTRAOPERATIVE CHOLANGIOGRAM;  Surgeon: Autumn Messing III, MD;  Location: [place];  Service: General;  Laterality: N/A;    There were no vitals filed for this visit.  Visit Diagnosis:  Pain, hip, right  Weakness of right hip  Stiffness due to immobility  Decreased strength, endurance, and mobility      Subjective Assessment - 05/16/15 0801    Subjective Patient reports no pain this morning and last night states only 3/10 in tailbone when going from sit to stand. Patient reporting postive response to estim with relief lasting 3 or more  days.   How long can you stand comfortably? standing on both unlimited; standing on Lt without wt on Rt then shifting back to Rt still gives severe pain in sacral area   How long can you walk comfortably? 20-25 min   Currently in Pain? No/denies   Pain Score --  Least 0/10, Avg 1/10, Worst 8/10   Pain Location Sacrum   Pain Orientation Mid;Right   Pain Descriptors / Indicators Discomfort;Aching   Aggravating Factors  changing positions from lying or sitting and sitting to standing, standing on Lt without wt on Rt then shifting back to Rt still gives severe pain in sacral area   Pain Relieving Factors estim            [facility] PT Assessment - 05/16/15 0800    Assessment   Medical Diagnosis Rt snapping hip/sciatica   Next MD Visit 05/16/15   ROM / Strength   AROM / PROM / Strength AROM;Strength   AROM   AROM Assessment Site Lumbar   Lumbar Flexion 95%   Lumbar Extension 80%   Lumbar - Right Side Bend 90%   Lumbar - Left Side Bend 90%   Lumbar - Right Rotation 80%   Lumbar - Left Rotation 80%  Strength   Overall Strength Comments 5/5 bilat LE's without pain except some discomfort with resisted Rt hip flexion                     [facility] Adult PT Treatment/Exercise - 05/16/15 0800    Lumbar Exercises: Supine   Bent Knee Raise 3 seconds;10 reps  2 sets   Bent Knee Raise Limitations TrA + alternating double knee raise   Lumbar Exercises: Quadruped   Madcat/Old Horse 20 reps   Opposite Arm/Leg Raise Right arm/Left leg;Left arm/Right leg;10 reps;3 seconds  2 sets   Knee/Hip Exercises: Supine   Bridges Both;10 reps;2 sets   Bridges Limitations TrA + hip ABD isometric with green TB   Other Supine Knee/Hip Exercises Alt SL Hip Abd/ER clam with green TB 2x10   Knee/Hip Exercises: Sidelying   Clams Rt hip Abd/ER clam with green TB 2x10 in Lt sidelying                     PT Long Term Goals - 05/16/15 0805    PT LONG TERM GOAL #1   Title Improve  flexibility through hip flexors allowing patient to achieve full active hip extension 05/30/15   Status Achieved   PT LONG TERM GOAL #2   Title 5/5 painfree hip strength 05/30/15   Status Partially Met  5/5 strength but slight discomfort with resisted right hip flexion   PT LONG TERM GOAL #3   Title tolerate walking for 30 min without pain 05/30/15   Status On-going  Able to walk 20-25 min before onset of pain in sacral area   PT LONG TERM GOAL #4   Title Improve FOTO to </= 23% limitation 05/30/15   Status On-going               Plan - 05/16/15 0820    Clinical Impression Statement Patient pleased with progress so far with pain overall improved to only 1/10 on average, with pain now typically localized to sacral area with some radiation into right buttock at times. Patient reports greatest relief from estim with relief lasting 3 or more days. Patient reports snapping sensation still present but no longer with pain associated. Lumbar/hip flexibility and ROM significantly improved. Hip strength improved but still some discomfort with resisted right hip flexion. Given postive response to estim, patient may benefit from home TENS unit for pain management as needed.   PT Next Visit Plan Progress with core stabilization and LE stretching/strengthening per pain response, manual therapy & modalities PRN   Recommended Other Services Home TENS unit   Consulted and Agree with Plan of Care Patient        Problem List Patient Active Problem List   Diagnosis Date Noted  . Right hip pain 04/15/2015  . Preventative health care 10/30/2014  . MDD (major depressive disorder), recurrent episode, moderate ([place]) 10/04/2014  . Social anxiety disorder 10/04/2014  . Panic disorder without agoraphobia 10/04/2014  . PTSD (post-traumatic stress disorder) 10/04/2014  . Allergic rhinitis 08/15/2014  . Daytime somnolence 08/15/2014  . HSV (herpes simplex virus) infection 06/18/2014  . RUQ pain  05/11/2014  . Nausea with vomiting 05/11/2014  . Gallbladder sludge 05/11/2014  . Mood disorder ([place]) 03/26/2014  . Insomnia 03/26/2014  . Constipation 10/18/2013  . BMI 35.0-35.9,adult 10/18/2013  . Seasonal allergies 10/18/2013  . Depression 09/22/2013  . Obesity, unspecified 09/22/2013  . Insulin resistance 05/16/2012  . Low testosterone 05/16/2012  . Fatty liver disease,  nonalcoholic 03/52/4818  . Gallstones 08/26/2010  . GERD 01/31/2010  . [facility] DEFICIENCY 01/01/2010    Percival Spanish, PT, MPT 05/16/2015, 8:46 AM  [facility] [address]  [place], [place], [address] Phone: [phone]   Fax:  [phone]  Name: Rhonda Tate MRN: [id number] Date of Birth: 1985/08/26

## 2015-05-20 ENCOUNTER — Ambulatory Visit: Payer: Commercial Managed Care - PPO | Admitting: Physical Therapy

## 2015-05-21 NOTE — Assessment & Plan Note (Signed)
Her snapping hip, possible sciatica pain has improved with PT, home exercises though still gets pain here some.  Now with some coccyx/sacrum pain without injury.  She will transition to home exercises.  F/u in 6 weeks or prn.

## 2015-05-21 NOTE — Progress Notes (Signed)
PCP and consultation requested by: Lemont Fillers., NP  Subjective:   HPI: Patient is a 29 y.o. female here for right hip pain.  11/3: Patient reports she's had anterior and lateral right hip pain since Saturday. No known injury or trauma. Recalls throwing trash onto the back of a truck around the time this started. Has had a popping noise in this area of hip, 5/10 level of pain that is sharp. Worse at night. Tried ibuprofen, heat. No prior issues. Radiographs negative. No skin changes, fever, other complaints.  12/8: Patient reports she has improved since last visit. Pain level only 2/10 now. More of a discomfort feeling now. Doing PT, home exercises. Worse when changing positions. Pain has moved to the tailbone region No skin changes, fever, other complaints.  Past Medical History  Diagnosis Date  . Anemia     iron deficiency  . Depression   . Acid reflux   . Bulging disc     slight  . Ear infection   . Heartburn   . Anxiety   . Bipolar disorder (HCC)     year 2009  . Liver disease     Fatty Liver Disease 2012  . Fatigue   . Tachycardia April 27, 2014  . Constipation   . Diarrhea   . Headache   . OSA (obstructive sleep apnea)     mild  . PCOS (polycystic ovarian syndrome)     Current Outpatient Prescriptions on File Prior to Visit  Medication Sig Dispense Refill  . Beclomethasone Dipropionate (QNASL) 80 MCG/ACT AERS Place 2 sprays into the nose daily. 8.7 g 5  . escitalopram (LEXAPRO) 20 MG tablet Take 1 tablet (20 mg total) by mouth daily. 30 tablet 1  . lithium carbonate (ESKALITH) 450 MG CR tablet Take 2 tablets (900 mg total) by mouth daily. 60 tablet 1  . MICROGESTIN FE 1/20 1-20 MG-MCG tablet     . RABEprazole (ACIPHEX) 20 MG tablet Take 1 tablet (20 mg total) by mouth daily. 30 tablet 5   No current facility-administered medications on file prior to visit.    Past Surgical History  Procedure Laterality Date  . Wisdom tooth extraction     . Wisdom tooth extraction    . Colonoscopy    . Upper gi endoscopy    . Cholecystectomy, laparoscopic  07/09/2014  . Cholecystectomy N/A 07/12/2014    Procedure: LAPAROSCOPIC CHOLECYSTECTOMY WITH INTRAOPERATIVE CHOLANGIOGRAM;  Surgeon: Chevis Pretty III, MD;  Location[facility];  Service: General;  Laterality: N/A;    Allergies  Allergen Reactions  . Iodinated Diagnostic Agents Other (See Comments)    Numbness on right side of body (only from MRI dye)  . Other Other (See Comments)    Vanilla causes migraines  . Risperidone And Related Other (See Comments)    Uncontrollable twitching  . Trazodone And Nefazodone Diarrhea and Other (See Comments)    Severe abdominal cramping, loss of appetite  . Wellbutrin [Bupropion] Other (See Comments)    Elevated BP, tacycardia, insomnia, no appetite (only when taking phentermine)   . Hydromorphone Hcl Itching, Nausea And Vomiting and Rash  . Sulfa Antibiotics Nausea And Vomiting and Rash  . Tape Itching and Rash    Paper tape ok    Social History   Social History  . Marital Status: Single    Spouse Name: N/A  . Number of Children: N/A  . Years of Education: N/A   Occupational History  . Not on file.   Social History  Main Topics  . Smoking status: Never Smoker   . Smokeless tobacco: Never Used  . Alcohol Use: 0.0 oz/week    0 Standard drinks or equivalent per week     Comment: a few times a year- drink 1-2 drinks per episode  . Drug Use: No  . Sexual Activity: Not on file   Other Topics Concern  . Not on file   Social History Narrative   Has 2 dogs and 2 cats   Junior Account   Completed Bachelors   Single, no children   Lives with animales   [place]   ENjoys museums, historical things, movies, friends    Family History  Problem Relation Age of Onset  . Cancer Other     breast/ family hx of  . Diabetes Other     family hx of  . Hyperlipidemia Other     family hx of  . Hypertension Other     family hx of  .  Thyroid disease Other     family hx of  . Thyroid disease Mother   . GER disease Mother   . Hyperlipidemia Mother   . Other Mother     uterine polyp  . Hypertension Father   . Hyperlipidemia Father   . Diabetes Father     type II  . Gout Father   . Cancer Maternal Aunt     breast cancer 50's  . Drug abuse Maternal Aunt   . Cancer Paternal Uncle     lung  . Heart attack Maternal Grandmother   . Cancer Paternal Grandmother     breast 70's  . Heart disease Paternal Grandfather     heart failure  . Drug abuse Maternal Uncle   . Drug abuse Cousin   . Alcohol abuse Neg Hx   . Anxiety disorder Neg Hx   . Bipolar disorder Neg Hx   . Depression Neg Hx     BP 119/82 mmHg  Pulse 79  Ht 5' (1.524 m)  Wt 190 lb (86.183 kg)  BMI 37.11 kg/m2  Review of Systems: See HPI above.    Objective:  Physical Exam:  Gen: NAD  Back: No gross deformity, scoliosis. Mild TTP midline sacrum, coccyx.  No other tenderness. FROM. Strength LEs 5/5 all muscle groups.   Negative SLRs. Sensation intact to light touch bilaterally.  Right hip: No gross deformity, swelling, bruising. No TTP anterior hip over hip flexor, lateral IT band proximally. Negative logroll bilateral hips Negative fabers and piriformis stretches.    Assessment & Plan:  1. Right hip pain - Her snapping hip, possible sciatica pain has improved with PT, home exercises though still gets pain here some.  Now with some coccyx/sacrum pain without injury.  She will transition to home exercises.  F/u in 6 weeks or prn.

## 2015-05-23 ENCOUNTER — Ambulatory Visit: Payer: Commercial Managed Care - PPO | Admitting: Physical Therapy

## 2015-05-24 ENCOUNTER — Ambulatory Visit: Payer: Commercial Managed Care - PPO | Attending: Pulmonary Disease

## 2015-05-24 DIAGNOSIS — G4733 Obstructive sleep apnea (adult) (pediatric): Secondary | ICD-10-CM | POA: Insufficient documentation

## 2015-05-28 ENCOUNTER — Ambulatory Visit: Payer: Commercial Managed Care - PPO | Admitting: Physical Therapy

## 2015-05-28 DIAGNOSIS — M2560 Stiffness of unspecified joint, not elsewhere classified: Secondary | ICD-10-CM

## 2015-05-28 DIAGNOSIS — Z7409 Other reduced mobility: Secondary | ICD-10-CM

## 2015-05-28 DIAGNOSIS — R531 Weakness: Secondary | ICD-10-CM

## 2015-05-28 DIAGNOSIS — M25551 Pain in right hip: Secondary | ICD-10-CM | POA: Diagnosis not present

## 2015-05-28 DIAGNOSIS — R29898 Other symptoms and signs involving the musculoskeletal system: Secondary | ICD-10-CM

## 2015-05-28 DIAGNOSIS — R6889 Other general symptoms and signs: Secondary | ICD-10-CM

## 2015-05-28 NOTE — Therapy (Signed)
[facility] [address]  [place], [place], [address] Phone: [phone]   Fax:  [phone]  Physical Therapy Treatment  Patient Details  Name: Rhonda Tate MRN: [id number] Date of Birth: [birth year] Referring Provider: Dr. Ned Clines  Encounter Date: 05/28/2015      PT End of Session - 05/28/15 1633    Visit Number 7   Number of Visits 12   Date for PT Re-Evaluation 05/30/15   PT Start Time 1618   PT Stop Time 1705   PT Time Calculation (min) 47 min   Activity Tolerance Patient tolerated treatment well   Behavior During Therapy [facility] for tasks assessed/performed      Past Medical History  Diagnosis Date  . Anemia     iron deficiency  . Depression   . Acid reflux   . Bulging disc     slight  . Ear infection   . Heartburn   . Anxiety   . Bipolar disorder ([place])     year 2009  . Liver disease     Fatty Liver Disease 2012  . Fatigue   . Tachycardia April 27, 2014  . Constipation   . Diarrhea   . Headache   . OSA (obstructive sleep apnea)     mild  . PCOS (polycystic ovarian syndrome)     Past Surgical History  Procedure Laterality Date  . Wisdom tooth extraction    . Wisdom tooth extraction    . Colonoscopy    . Upper gi endoscopy    . Cholecystectomy, laparoscopic  07/09/2014  . Cholecystectomy N/A 07/12/2014    Procedure: LAPAROSCOPIC CHOLECYSTECTOMY WITH INTRAOPERATIVE CHOLANGIOGRAM;  Surgeon: Autumn Messing III, MD;  Location: [facility];  Service: General;  Laterality: N/A;    There were no vitals filed for this visit.  Visit Diagnosis:  Pain, hip, right  Weakness of right hip  Stiffness due to immobility  Decreased strength, endurance, and mobility      Subjective Assessment - 05/28/15 1624    Subjective Patient missed last week due to illness. Saw Dr. Barbaraann Barthel who was pleased with the progress in her hip and feels that the "tailbone" pain is not likely to change with further therapy.  Told her to continue with scheduled therapy visits but does not weant her to go further with PT so today will be her last visit.   Currently in Pain? No/denies            [facility] PT Assessment - 05/28/15 1618    Assessment   Medical Diagnosis Rt snapping hip/sciatica   Observation/Other Assessments   Focus on Therapeutic Outcomes (FOTO)  Hip - 79% (21% limitation)   Strength   Overall Strength Comments 5/5 bilat LE's without pain                   [facility] Adult PT Treatment/Exercise - 05/28/15 1618    Knee/Hip Exercises: Supine   Bridges Both;2 sets;15 reps   Bridges Limitations TrA + hip ABD isometric with blue TB   Other Supine Knee/Hip Exercises Alt SL Hip Abd/ER with blue TB 2x15   Knee/Hip Exercises: Sidelying   Clams Rt hip Abd/ER clam with blue TB 2x10 in Lt sidelying                PT Education - 05/28/15 1815    Education provided Yes   Education Details Final review of HEP with instruction in self-progression of HEP exercises; Postural  awareness emphasizing proper sitting posture while at work desk with use of lumbar support to maintain neutral spine and 90/90 hip knee postitioning; TENS electrode placement for sacral/SIJ pain   Person(s) Educated Patient   Methods Explanation;Demonstration   Comprehension Verbalized understanding;Returned demonstration             PT Long Term Goals - 05/28/15 1655    PT LONG TERM GOAL #1   Title Improve flexibility through hip flexors allowing patient to achieve full active hip extension 05/30/15   Status Achieved   PT LONG TERM GOAL #2   Title 5/5 painfree hip strength 05/30/15   Status Achieved   PT LONG TERM GOAL #3   Title tolerate walking for 30 min without pain 05/30/15   Status Partially Met  No longer limited by hip pain but still occasionally limited by pain in sacrum   PT LONG TERM GOAL #4   Title Improve FOTO to </= 23% limitation 05/30/15   Status Achieved  21% limitation                Plan - 05/28/15 1809    Clinical Impression Statement Given MD satisfaction with progress with hip pain and direction to stop therapy after today's visit, focus of today's session was on eduction in continued HEP and home management of continued sacral/SIJ pain. Patient obtained home TENS unit but had questions regarding electrode placement, therefore reviewed TENS set-up and positioning of electrodes for sacral/SIJ pain with patient verbalizing understanding. Reviewed proper sitting posture at work desk targeting neutral spine and 90/90 hip knee position with good foot support. Also reviewed HEP exercises and provided education in HEP progression on own as part of HEP. Patient demonstrating improved LE flexibility and 5/5 hip strength without pain. All goals met except walking for 30 minutes without which was partially met for no hip pain with walking, but still experiences sacral/SIJ pain when walking longer distances.   PT Next Visit Plan Discharge   Consulted and Agree with Plan of Care Patient        Problem List Patient Active Problem List   Diagnosis Date Noted  . Right hip pain 04/15/2015  . Preventative health care 10/30/2014  . MDD (major depressive disorder), recurrent episode, moderate ([place]) 10/04/2014  . Social anxiety disorder 10/04/2014  . Panic disorder without agoraphobia 10/04/2014  . PTSD (post-traumatic stress disorder) 10/04/2014  . Allergic rhinitis 08/15/2014  . Daytime somnolence 08/15/2014  . HSV (herpes simplex virus) infection 06/18/2014  . RUQ pain 05/11/2014  . Nausea with vomiting 05/11/2014  . Gallbladder sludge 05/11/2014  . Mood disorder ([place]) 03/26/2014  . Insomnia 03/26/2014  . Constipation 10/18/2013  . BMI 35.0-35.9,adult 10/18/2013  . Seasonal allergies 10/18/2013  . Depression 09/22/2013  . Obesity, unspecified 09/22/2013  . Insulin resistance 05/16/2012  . Low testosterone 05/16/2012  . Fatty liver disease, nonalcoholic 53/66/4403  .  Gallstones 08/26/2010  . GERD 01/31/2010  . [facility] DEFICIENCY 01/01/2010    Percival Spanish 05/28/2015, 6:38 PM  [facility] [address]  [place], [place], [address] Phone: [phone]   Fax:  [phone]  Name: Rhonda Tate MRN: [id number] Date of Birth: 1986/02/18   PHYSICAL THERAPY DISCHARGE SUMMARY  Visits from Start of Care: 7  Current functional level related to goals / functional outcomes:   Patient demonstrating improved LE flexibility and 5/5 hip strength without pain. 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 All goals met except walking for 30 minutes without which was partially met for no hip pain with walking, but still experiences sacral/SIJ pain when walking longer distances.   Remaining deficits:  Sacral/SIJ pain    Education / Equipment:  HEP including self-progression of exercises, Postural awareness in standing and sitting at work desk, home TENS unit education  Plan: Patient agrees to discharge.  Patient goals were met. Patient is being discharged due to the physician's request.  ?????       Percival Spanish, PT, MPT 05/28/2015, 6:38 PM  [facility] [address]  [place], [place], [address] Phone: [phone]   Fax:  [phone]

## 2015-06-11 ENCOUNTER — Ambulatory Visit (INDEPENDENT_AMBULATORY_CARE_PROVIDER_SITE_OTHER): Payer: Commercial Managed Care - PPO | Admitting: Psychiatry

## 2015-06-11 ENCOUNTER — Encounter (HOSPITAL_COMMUNITY): Payer: Self-pay | Admitting: Psychiatry

## 2015-06-11 VITALS — BP 127/85 | HR 80 | Resp 12 | Ht 60.0 in | Wt 192.8 lb

## 2015-06-11 DIAGNOSIS — F4310 Post-traumatic stress disorder, unspecified: Secondary | ICD-10-CM | POA: Diagnosis not present

## 2015-06-11 DIAGNOSIS — F331 Major depressive disorder, recurrent, moderate: Secondary | ICD-10-CM

## 2015-06-11 DIAGNOSIS — F410 Panic disorder [episodic paroxysmal anxiety] without agoraphobia: Secondary | ICD-10-CM | POA: Diagnosis not present

## 2015-06-11 DIAGNOSIS — F4010 Social phobia, unspecified: Secondary | ICD-10-CM | POA: Diagnosis not present

## 2015-06-11 DIAGNOSIS — G4733 Obstructive sleep apnea (adult) (pediatric): Secondary | ICD-10-CM | POA: Diagnosis not present

## 2015-06-11 MED ORDER — LITHIUM CARBONATE ER 300 MG PO TBCR: 1200.0000 mg | EXTENDED_RELEASE_TABLET | Freq: Every day | ORAL | Status: DC

## 2015-06-11 MED ORDER — GABAPENTIN 100 MG PO CAPS: 100.0000 mg | ORAL_CAPSULE | Freq: Three times a day (TID) | ORAL | Status: DC
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
## 2015-06-12 ENCOUNTER — Telehealth: Payer: Self-pay | Admitting: *Deleted

## 2015-06-12 DIAGNOSIS — G4733 Obstructive sleep apnea (adult) (pediatric): Secondary | ICD-10-CM

## 2015-06-12 NOTE — Telephone Encounter (Signed)
Called pt to inform her that we were placing order for a CPAP. Pt states she has has a CPAP since April 2016 with [facility]. States she came to us b/c her PCP sent her for a sleep study and then put her on a CPAP and never said anything else in regards to it so she scheduled herself with us for sleep. States when she was placed on her CPAP she was set up for an auto of 5-25. Our CPAP titration study says she needs to be on 12cm please advise what setting you would like her on and I will place order to [facility]. Papers placed in your folder to sign to be scanned in chart. Thanks.

## 2015-06-14 NOTE — Telephone Encounter (Signed)
Order entered for change in CPAP pressure. Nothing further needed.

## 2015-06-14 NOTE — Telephone Encounter (Signed)
Can put her on a CPAP level of 12.

## 2015-06-20 ENCOUNTER — Institutional Professional Consult (permissible substitution): Payer: Self-pay | Admitting: Pulmonary Disease

## 2015-06-24 ENCOUNTER — Ambulatory Visit (INDEPENDENT_AMBULATORY_CARE_PROVIDER_SITE_OTHER): Payer: Commercial Managed Care - PPO | Admitting: Family

## 2015-06-24 ENCOUNTER — Encounter: Payer: Self-pay | Admitting: Family

## 2015-06-24 VITALS — BP 129/79 | HR 84 | Temp 98.4°F | Resp 20 | Ht 60.0 in | Wt 191.0 lb

## 2015-06-24 DIAGNOSIS — F329 Major depressive disorder, single episode, unspecified: Secondary | ICD-10-CM

## 2015-06-24 DIAGNOSIS — F32A Depression, unspecified: Secondary | ICD-10-CM

## 2015-06-24 DIAGNOSIS — G4733 Obstructive sleep apnea (adult) (pediatric): Secondary | ICD-10-CM

## 2015-06-24 DIAGNOSIS — J019 Acute sinusitis, unspecified: Secondary | ICD-10-CM

## 2015-06-24 MED ORDER — AMOXICILLIN-POT CLAVULANATE 875-125 MG PO TABS: 1.0000 | ORAL_TABLET | Freq: Two times a day (BID) | ORAL | Status: DC

## 2015-06-24 NOTE — Progress Notes (Signed)
Pre visit review using our clinic review tool, if applicable. No additional management support is needed unless otherwise documented below in the visit note. 

## 2015-06-24 NOTE — Progress Notes (Signed)
Subjective:    Patient ID: Rhonda Tate, female    DOB: [birth year], 30 y.o.   MRN: [id number]  HPI  Rhonda Tate is a 30 yr old female who presents today for follow up.  1) Chronic fatigue- She does have hx of OSA and is currently maintained on CPAP. She is followed by Dr. Nicholos Johns for OSA. TFT's which were checked back in October were normal.  CBC was also normal at that time.  Reports that her CPAP pressure was increased a week or two ago.  Notes that the majority of her tiredness was due to lexapro.  Still does not feel refreshed.    2) Depression- currently on lithobid and gabapentin. She is followed by Dr. Michae Kava and her lithobid was increased on 06/11/15.  She was also started on trial of neurontin 100 mg tid for anxiety at that time. She continues to work with a therapist.   3) Ear pain- reports right ear pain x 2-3 weeks.  + sinus pressure, cough, dizziness.  Sometimes if feels like a stabbling pain, sometimes like a tickling in her ear. Wakes her up at time, some fullness in the right ear.  Has some right sided sinus pressure. No significant drainage. Tried mucinex without improvement.  + cough- sometimes made worse by cough.  She feels like her gerd symptoms are well controlled by aciphex. Denies dental pain or fever.    Review of Systems See HPI  Past Medical History  Diagnosis Date  . Anemia     iron deficiency  . Depression   . Acid reflux   . Bulging disc     slight  . Ear infection   . Heartburn   . Anxiety   . Bipolar disorder ([facility])     year 2009  . Liver disease     Fatty Liver Disease 2012  . Fatigue   . Tachycardia April 27, 2014  . Constipation   . Diarrhea   . Headache   . OSA (obstructive sleep apnea)     mild  . PCOS (polycystic ovarian syndrome)     Social History   Social History  . Marital Status: Single    Spouse Name: N/A  . Number of Children: N/A  . Years of Education: N/A   Occupational History  . Not on file.    Social History Main Topics  . Smoking status: Never Smoker   . Smokeless tobacco: Never Used  . Alcohol Use: 0.0 oz/week    0 Standard drinks or equivalent per week     Comment: a few times a year- drink 1-2 drinks per episode  . Drug Use: No  . Sexual Activity: Not on file   Other Topics Concern  . Not on file   Social History Narrative   Has 2 dogs and 2 cats   Junior Account   Completed Bachelors   Single, no children   Lives with animales   Born in [place]   ENjoys museums, historical things, movies, friends    Past Surgical History  Procedure Laterality Date  . Wisdom tooth extraction    . Wisdom tooth extraction    . Colonoscopy    . Upper gi endoscopy    . Cholecystectomy, laparoscopic  07/09/2014  . Cholecystectomy N/A 07/12/2014    Procedure: LAPAROSCOPIC CHOLECYSTECTOMY WITH INTRAOPERATIVE CHOLANGIOGRAM;  Surgeon: Chevis Pretty III, MD;  Location: [facility];  Service: General;  Laterality: N/A;    Family History  Problem Relation Age of  Onset  . Cancer Other     breast/ family hx of  . Diabetes Other     family hx of  . Hyperlipidemia Other     family hx of  . Hypertension Other     family hx of  . Thyroid disease Other     family hx of  . Thyroid disease Mother   . GER disease Mother   . Hyperlipidemia Mother   . Other Mother     uterine polyp  . Hypertension Father   . Hyperlipidemia Father   . Diabetes Father     type II  . Gout Father   . Cancer Maternal Aunt     breast cancer 50's  . Drug abuse Maternal Aunt   . Cancer Paternal Uncle     lung  . Heart attack Maternal Grandmother   . Cancer Paternal Grandmother     breast 70's  . Heart disease Paternal Grandfather     heart failure  . Drug abuse Maternal Uncle   . Drug abuse Cousin   . Alcohol abuse Neg Hx   . Anxiety disorder Neg Hx   . Bipolar disorder Neg Hx   . Depression Neg Hx     Allergies  Allergen Reactions  . Iodinated Diagnostic Agents Other (See Comments)    Numbness on  right side of body (only from MRI dye)  . Other Other (See Comments)    Vanilla causes migraines  . Risperidone And Related Other (See Comments)    Uncontrollable twitching  . Trazodone And Nefazodone Diarrhea and Other (See Comments)    Severe abdominal cramping, loss of appetite  . Wellbutrin [Bupropion] Other (See Comments)    Elevated BP, tacycardia, insomnia, no appetite (only when taking phentermine)   . Hydromorphone Hcl Itching, Nausea And Vomiting and Rash  . Sulfa Antibiotics Nausea And Vomiting and Rash  . Tape Itching and Rash    Paper tape ok    Current Outpatient Prescriptions on File Prior to Visit  Medication Sig Dispense Refill  . gabapentin (NEURONTIN) 100 MG capsule Take 1 capsule (100 mg total) by mouth 3 (three) times daily. 90 capsule 1  . lithium carbonate (LITHOBID) 300 MG CR tablet Take 4 tablets (1,200 mg total) by mouth daily. 120 tablet 1  . MICROGESTIN FE 1/20 1-20 MG-MCG tablet     . RABEprazole (ACIPHEX) 20 MG tablet Take 1 tablet (20 mg total) by mouth daily. 30 tablet 5   No current facility-administered medications on file prior to visit.    BP 129/79 mmHg  Pulse 84  Temp(Src) 98.4 F (36.9 C) (Oral)  Resp 20  Ht 5' (1.524 m)  Wt 191 lb (86.637 kg)  BMI 37.30 kg/m2  SpO2 99%       Objective:   Physical Exam  Constitutional: She is oriented to person, place, and time. She appears well-developed and well-nourished.  HENT:  Head: Normocephalic and atraumatic.  Right Ear: Tympanic membrane and ear canal normal.  Left Ear: Tympanic membrane and ear canal normal.  Nose: Right sinus exhibits maxillary sinus tenderness and frontal sinus tenderness. Left sinus exhibits no maxillary sinus tenderness and no frontal sinus tenderness.  Mouth/Throat: No oropharyngeal exudate, posterior oropharyngeal edema or posterior oropharyngeal erythema.  Cardiovascular: Normal rate, regular rhythm and normal heart sounds.   No murmur heard. Pulmonary/Chest:  Effort normal and breath sounds normal. No respiratory distress. She has no wheezes.  Neurological: She is alert and oriented to person, place, and time.  Skin: Skin is warm and dry.  Psychiatric: She has a normal mood and affect. Her behavior is normal. Judgment and thought content normal.          Assessment & Plan:  Acute sinusitis- rx with augmentin, advised pt to continue claritin. Call if symptoms worsen or if symptoms do not improve.

## 2015-06-24 NOTE — Assessment & Plan Note (Signed)
Management per pulmonary.  Fatigue seems to be improving.

## 2015-06-24 NOTE — Patient Instructions (Signed)
Start Augmentin for sinus infection Continue OTC claritin once daily. Call if you develop fever, if symptoms worsen or if symptoms are not improved in 1 week.

## 2015-06-24 NOTE — Assessment & Plan Note (Signed)
Fair control, management per psychiatry.

## 2015-06-28 ENCOUNTER — Telehealth (HOSPITAL_COMMUNITY): Payer: Self-pay

## 2015-06-28 ENCOUNTER — Encounter: Payer: Self-pay | Admitting: Family

## 2015-06-28 DIAGNOSIS — F410 Panic disorder [episodic paroxysmal anxiety] without agoraphobia: Secondary | ICD-10-CM

## 2015-06-28 DIAGNOSIS — Z79899 Other long term (current) drug therapy: Secondary | ICD-10-CM

## 2015-06-28 NOTE — Telephone Encounter (Signed)
Rhonda Tate-- Also see phone note to Dr Lemar Lofty office.

## 2015-06-28 NOTE — Telephone Encounter (Signed)
Patient called and was very teary, she stated she is having increased depression and feelings of helplessness. Patient says she is not having thoughts of harming herself or others. She is having panic attacks at night , waking up sweaty, crying and shaking and does not know why. She is unable to sleep more that a few hours at a time. Patient has had a recent medication change and she states that the attacks are getting more frequent. Patient was advised that I would send this message to Dr. Ladona Ridgel - covering for Dr. Michae Kava on Monday 07/01/2015  and if over the weekend she felt that she needed to be seen that she could come here or to her local ED. Patient reports that she stopped the Lexapro in Oct. So unlikely discontinuation syndrome. She would like a call back to discuss, last appt 06/11/2015, f/u on 08/15/2015

## 2015-07-02 MED ORDER — ALPRAZOLAM 0.5 MG PO TABS: 0.5000 mg | ORAL_TABLET | Freq: Two times a day (BID) | ORAL | Status: DC | PRN

## 2015-07-02 NOTE — Telephone Encounter (Signed)
Pt states she is really depressed and she is having awful panic attacks at night. She is having at least 5 per night and the panic attacks wake her from sleep. It makes her feel like she is dying and her bones are breaking. She is also having more panic attacks during the day. Pt is isolating herself. Pt was seen in office 2 weeks ago and doesn't know what has changed. States she has been active and has social support. Pt started a weight loss program yesterday. Pt is taking Neurontin TID and notes SE of tremors and decreased strength in her grip of objects when holding them.   A/P: MDD- recurrent, moderate vs Bipolar II; Social Anxiety disorder, PTSD, Panic disorder without agoraphobia 1. Continue Lithobid and Neurontin 2. Labs ordered- Lithium level, CMP with Bun/Creatinine, TSH 3. Start trial of Xanax 0.5mg  po BID prn anxiety

## 2015-07-18 ENCOUNTER — Encounter: Payer: Self-pay | Admitting: Internal Medicine

## 2015-07-22 ENCOUNTER — Encounter: Payer: Self-pay | Admitting: Internal Medicine

## 2015-07-22 ENCOUNTER — Other Ambulatory Visit (HOSPITAL_COMMUNITY): Payer: Self-pay | Admitting: Psychiatry

## 2015-07-22 ENCOUNTER — Ambulatory Visit (INDEPENDENT_AMBULATORY_CARE_PROVIDER_SITE_OTHER): Payer: Commercial Managed Care - PPO | Admitting: Internal Medicine

## 2015-07-22 VITALS — BP 124/68 | HR 83 | Ht 60.0 in | Wt 180.2 lb

## 2015-07-22 DIAGNOSIS — G4733 Obstructive sleep apnea (adult) (pediatric): Secondary | ICD-10-CM | POA: Diagnosis not present

## 2015-07-22 MED ORDER — ZOLPIDEM TARTRATE 5 MG PO TABS: 5.0000 mg | ORAL_TABLET | Freq: Every evening | ORAL | Status: DC | PRN

## 2015-07-22 NOTE — Addendum Note (Signed)
Addended by: Maxwell Marion A on: 07/22/2015 11:52 AM   Modules accepted: Orders

## 2015-07-22 NOTE — Patient Instructions (Signed)
--  Start ambien 5 mg every night.  

## 2015-07-22 NOTE — Progress Notes (Signed)
[facility]     Assessment and Plan:  Obstructive sleep apnea. Lindo was diagnosed with mild obstructive sleep apnea on a home sleep study and started to feel better after starting CPAP. Subsequently underwent a CPAP titration study which showed that she required a level of 12, which was initiated.  -. Her most recent download shows that she has inadequate use of CPAP at just under 3 hours. Recommend that she use the CPAP every night for the entire night in order to get the maximum amount of benefit. -She involuntarily takes off the mask at night. We will start the patient on Ambien 5 mg daily at bedtime to help her keep the mask on.  Depression, bipolar disorder. -Patient is currently on lithium, the dose was increased recently uncertain of this could be the cause of her increasing sleepiness.  Excessive daytime sleepiness. -This is likely due to inadequately treated obstructive sleep apnea, as well as possibly medication effect.    Date: 07/22/2015  MRN# [id number] JIMYA CIANI 12-02-1985   [name] is a 30 y.o. old female seen in follow up for chief complaint of  Chief Complaint  Patient presents with  . Follow-up    pt. states she tries to wear CPAP everynight but recently have started pulling it off while alseep. tries wearing 7-8 hr. everynight. pressure set @ 12 feels that's good. no supplies needed. WUJ:[id number].     HPI:   The patient is a 30 year old female with a history of obstructive sleep apnea, currently on CPAP. 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 She also has a history of depression, bipolar disorder, polycystic ovarian syndrome, PTSD.  She was diagnosed with obstructive sleep apnea on a home sleep study, and then started directly on CPAP she initially felt improved on CPAP, but then had increasing symptoms of excessive daytime sleepiness.  Once again. She had never undergone a formal CPAP titration study, therefore she was sent for a CPAP titration study to see what her ideal pressure would be. We also recommend requested records from [facility] to see what her CPAP settings work.  It was noted that her CPAP was set on an auto setting between 5 and 25, she underwent a CPAP titration, which showed that she should be started on a CPAP level of 12, and this was initiated.  Review of her most recent download data while on a CPAP of 12, showed that her average AHI was 0.3, her usage was suboptimal, averaging approximately 2 hours and 49 minutes per night. On discussing this with the patient today. She says that she involuntarily takes the mask off at night, she wakes up in the morning and notes that the mask is off. If she wakes up at night in the mask, soft, she will put it back on, however, usually she does not wake up at night. She does take an occasional Xanax for panic attacks and notes that if she takes this at night. She will keep the mask on all night, however, this will make her groggy during the day. On the rare occasions where she has been able to keep the mask on all night without taking any medications, she notes she is much more awake and feels much better.  Medication:   Outpatient Encounter Prescriptions as of 07/22/2015  Medication Sig  . ALPRAZolam (XANAX) 0.5 MG tablet Take 1 tablet (0.5 mg total) by mouth  2 (two) times daily as needed for anxiety.  Marland Kitchen amoxicillin-clavulanate (AUGMENTIN) 875-125 MG tablet Take 1 tablet by mouth 2 (two) times daily.  Marland Kitchen gabapentin (NEURONTIN) 100 MG capsule Take 1 capsule (100 mg total) by mouth 3 (three) times daily.  Marland Kitchen lithium carbonate (LITHOBID) 300 MG CR tablet Take 4 tablets (1,200 mg total) by mouth daily.  Marland Kitchen MICROGESTIN FE 1/20 1-20 MG-MCG tablet   . RABEprazole (ACIPHEX) 20 MG tablet Take 1 tablet (20 mg total) by mouth daily.   No facility-administered encounter medications on file  as of 07/22/2015.     Allergies:  Iodinated diagnostic agents; Other; Risperidone and related; Trazodone and nefazodone; Wellbutrin; Hydromorphone hcl; Sulfa antibiotics; and Tape  Review of Systems: Gen:  Denies  fever, sweats. HEENT: Denies blurred vision. Cvc:  No dizziness, chest pain or heaviness Resp:   Denies cough or sputum porduction. Gi: Denies swallowing difficulty, stomach pain. constipation, bowel incontinence Gu:  Denies bladder incontinence, burning urine Ext:   No Joint pain, stiffness. Skin: No skin rash, easy bruising. Endoc:  No polyuria, polydipsia. Psych: No depression, insomnia. Other:  All other systems were reviewed and found to be negative other than what is mentioned in the HPI.   Physical Examination:   VS: BP 124/68 mmHg  Pulse 83  Ht 5' (1.524 m)  Wt 180 lb 3.2 oz (81.738 kg)  BMI 35.19 kg/m2  SpO2 99%  General Appearance: No distress  Neuro:without focal findings,  speech normal,  HEENT: PERRLA, EOM intact. Pulmonary: normal breath sounds, No wheezing.   CardiovascularNormal S1,S2.  No m/r/g.   Abdomen: Benign, Soft, non-tender. Renal:  No costovertebral tenderness  GU:  Not performed at this time. Endoc: No evident thyromegaly, no signs of acromegaly. Skin:   warm, no rash. Extremities: normal, no cyanosis, clubbing.   LABORATORY PANEL:   CBC No results for input(s): WBC, HGB, HCT, PLT in the last 168 hours. ------------------------------------------------------------------------------------------------------------------  Chemistries  No results for input(s): NA, K, CL, CO2, GLUCOSE, BUN, CREATININE, CALCIUM, MG, AST, ALT, ALKPHOS, BILITOT in the last 168 hours.  Invalid input(s): GFRCGP ------------------------------------------------------------------------------------------------------------------  Cardiac Enzymes No results for input(s): TROPONINI in the last 168  hours. ------------------------------------------------------------  RADIOLOGY:   No results found for this or any previous visit. No results found for this or any previous visit. ------------------------------------------------------------------------------------------------------------------  Thank  you for allowing [facility] to assist in the care of your patient. Our recommendations are noted above.  Please contact us if we can be of further service.   Wells Guiles, MD.  [facility] Office Number: [phone]  Santiago Glad, M.D.  Stephanie Acre, M.D.  Billy Fischer, M.D

## 2015-08-15 ENCOUNTER — Ambulatory Visit (HOSPITAL_COMMUNITY): Payer: Self-pay | Admitting: Psychiatry

## 2015-08-20 ENCOUNTER — Ambulatory Visit (INDEPENDENT_AMBULATORY_CARE_PROVIDER_SITE_OTHER): Payer: Commercial Managed Care - PPO | Admitting: Family

## 2015-08-20 ENCOUNTER — Ambulatory Visit (HOSPITAL_COMMUNITY): Payer: Self-pay | Admitting: Psychiatry

## 2015-08-20 ENCOUNTER — Encounter: Payer: Self-pay | Admitting: Family

## 2015-08-20 VITALS — BP 110/82 | HR 82 | Temp 98.3°F | Resp 16 | Ht 60.0 in | Wt 178.6 lb

## 2015-08-20 DIAGNOSIS — I889 Nonspecific lymphadenitis, unspecified: Secondary | ICD-10-CM | POA: Diagnosis not present

## 2015-08-20 DIAGNOSIS — H6691 Otitis media, unspecified, right ear: Secondary | ICD-10-CM | POA: Diagnosis not present

## 2015-08-20 MED ORDER — AMOXICILLIN 500 MG PO CAPS: 500.0000 mg | ORAL_CAPSULE | Freq: Two times a day (BID) | ORAL | Status: DC

## 2015-08-20 NOTE — Progress Notes (Signed)
Pre visit review using our clinic review tool, if applicable. No additional management support is needed unless otherwise documented below in the visit note. 

## 2015-08-20 NOTE — Patient Instructions (Signed)
Please start amoxicillin for your ear infection. Call if tender lymph node in the left groin worsens or does not resolve in the next 1-2 weeks.

## 2015-08-20 NOTE — Progress Notes (Signed)
Subjective:    Patient ID: Rhonda Tate, female    DOB: 1985-12-18, 30 y.o.   MRN: [id number]  HPI  Rhonda Tate is a 30 yr old female who presents today with c/o R ear pain, chills, began yesterday. Also reports "pressure around eyes."   Reports left inguinal Lymph node. Reports temp 99. Denies nasal congestion.  Has throat 'tickle" but denies cough.   She reports that she has not been sexually active in 2 years.     Review of Systems See HPI  Past Medical History  Diagnosis Date  . Anemia     iron deficiency  . Depression   . Acid reflux   . Bulging disc     slight  . Ear infection   . Heartburn   . Anxiety   . Bipolar disorder (HCC)     year 2009  . Liver disease     Fatty Liver Disease 2012  . Fatigue   . Tachycardia April 27, 2014  . Constipation   . Diarrhea   . Headache   . OSA (obstructive sleep apnea)     mild  . PCOS (polycystic ovarian syndrome)     Social History   Social History  . Marital Status: Single    Spouse Name: N/A  . Number of Children: N/A  . Years of Education: N/A   Occupational History  . Not on file.   Social History Main Topics  . Smoking status: Never Smoker   . Smokeless tobacco: Never Used  . Alcohol Use: 0.0 oz/week    0 Standard drinks or equivalent per week     Comment: a few times a year- drink 1-2 drinks per episode  . Drug Use: No  . Sexual Activity: Not on file   Other Topics Concern  . 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 Wisdom tooth extraction    . Wisdom tooth extraction    . Colonoscopy    . Upper gi endoscopy    . Cholecystectomy, laparoscopic  07/09/2014  . Cholecystectomy N/A 07/12/2014    Procedure: LAPAROSCOPIC CHOLECYSTECTOMY WITH INTRAOPERATIVE  CHOLANGIOGRAM;  Surgeon: Chevis PrettyPaul Toth III, MD;  Location: [facility] OR;  Service: General;  Laterality: N/A;    Family History  Problem Relation Age of Onset  . Cancer Other     breast/ family hx of  . Diabetes Other     family hx of  . Hyperlipidemia Other     family hx of  . Hypertension Other     family hx of  . Thyroid disease Other     family hx of  . Thyroid disease Mother   . GER disease Mother   . Hyperlipidemia Mother   . Other Mother     uterine polyp  . Hypertension Father   . Hyperlipidemia Father   . Diabetes Father     type II  . Gout Father   . Cancer Maternal Aunt     breast cancer 50's  . Drug abuse Maternal Aunt   . Cancer Paternal Uncle     lung  . Heart attack Maternal Grandmother   . Cancer Paternal Grandmother     breast 70's  .  Heart disease Paternal Grandfather     heart failure  . Drug abuse Maternal Uncle   . Drug abuse Cousin   . Alcohol abuse Neg Hx   . Anxiety disorder Neg Hx   . Bipolar disorder Neg Hx   . Depression Neg Hx     Allergies  Allergen Reactions  . Iodinated Diagnostic Agents Other (See Comments)    Numbness on right side of body (only from MRI dye)  . Other Other (See Comments)    Vanilla causes migraines  . Risperidone And Related Other (See Comments)    Uncontrollable twitching  . Trazodone And Nefazodone Diarrhea and Other (See Comments)    Severe abdominal cramping, loss of appetite  . Wellbutrin [Bupropion] Other (See Comments)    Elevated BP, tacycardia, insomnia, no appetite (only when taking phentermine)   . Hydromorphone Hcl Itching, Nausea And Vomiting and Rash  . Sulfa Antibiotics Nausea And Vomiting and Rash  . Tape Itching and Rash    Paper tape ok    Current Outpatient Prescriptions on File Prior to Visit  Medication Sig Dispense Refill  . gabapentin (NEURONTIN) 100 MG capsule Take 1 capsule (100 mg total) by mouth 3 (three) times daily. 90 capsule 1  . lithium carbonate (LITHOBID) 300 MG CR tablet Take 4  tablets (1,200 mg total) by mouth daily. 120 tablet 1  . MICROGESTIN FE 1/20 1-20 MG-MCG tablet     . RABEprazole (ACIPHEX) 20 MG tablet Take 1 tablet (20 mg total) by mouth daily. 30 tablet 5  . zolpidem (AMBIEN) 5 MG tablet Take 1 tablet (5 mg total) by mouth at bedtime as needed for sleep. 30 tablet 0  . ALPRAZolam (XANAX) 0.5 MG tablet Take 1 tablet (0.5 mg total) by mouth 2 (two) times daily as needed for anxiety. (Patient not taking: Reported on 08/20/2015) 60 tablet 0   No current facility-administered medications on file prior to visit.    BP 110/82 mmHg  Pulse 82  Temp(Src) 98.3 F (36.8 C) (Oral)  Resp 16  Ht 5' (1.524 m)  Wt 178 lb 9.6 oz (81.012 kg)  BMI 34.88 kg/m2  SpO2 98%       Objective:   Physical Exam  Constitutional: She appears well-developed and well-nourished.  HENT:  Right Ear: Ear canal normal. Tympanic membrane is erythematous.  Left Ear: Tympanic membrane and ear canal normal.  Mouth/Throat: No oropharyngeal exudate, posterior oropharyngeal edema or posterior oropharyngeal erythema.  Cardiovascular: Normal rate, regular rhythm and normal heart sounds.   No murmur heard. Pulmonary/Chest: Effort normal and breath sounds normal. No respiratory distress. She has no wheezes.  Genitourinary:  External vulva without lesions/rashes  Lymphadenopathy:  Tender, mildly palpable left inguinal lymph node.  Not clearly enlarged on exam, exam limited by overlying adipose  Psychiatric: She has a normal mood and affect. Her behavior is normal. Judgment and thought content normal.          Assessment & Plan:  R Otitis Media- rx with amoxicillin.  Advised pt to call if symptoms worsen or if symptoms do not improve.  Lymphadenitis- L inguinal lymph node pain- Advised pt to call if tenderness worsens or does not improve.

## 2015-08-26 ENCOUNTER — Encounter: Payer: Self-pay | Admitting: Family

## 2015-08-26 ENCOUNTER — Ambulatory Visit (INDEPENDENT_AMBULATORY_CARE_PROVIDER_SITE_OTHER): Payer: Commercial Managed Care - PPO | Admitting: Family

## 2015-08-26 VITALS — BP 128/86 | HR 77 | Temp 98.6°F | Resp 16 | Ht 60.0 in | Wt 180.4 lb

## 2015-08-26 DIAGNOSIS — H60391 Other infective otitis externa, right ear: Secondary | ICD-10-CM

## 2015-08-26 DIAGNOSIS — H6691 Otitis media, unspecified, right ear: Secondary | ICD-10-CM | POA: Diagnosis not present

## 2015-08-26 MED ORDER — AMOXICILLIN-POT CLAVULANATE 875-125 MG PO TABS: 1.0000 | ORAL_TABLET | Freq: Two times a day (BID) | ORAL | Status: DC

## 2015-08-26 MED ORDER — NEOMYCIN-POLYMYXIN-HC 3.5-10000-1 OT SUSP: 3.0000 [drp] | Freq: Four times a day (QID) | OTIC | Status: AC

## 2015-08-26 NOTE — Progress Notes (Signed)
Subjective:    Patient ID: Rhonda Tate, female    DOB: June 27, 1985, 30 y.o.   MRN: [id number]  HPI  Rhonda Tate is a 30 yr old female who presents today with chief complaint of right sided ear pain.  Pain continues despite 1 week course of amoxicillin. Had low grade temp today (99.9).  Reports ear pain/drainage fever on wednesday. Felt better Thursday and Friday, then worsened over the weekend. Reports + associated dizziness. Feels like room is spinning.  She reports that if she takes a deep breath feels sore around her color bone and beneath her breasts.  Reports that her tonsil stones have been "bad."  Employer is upset with her and she is requesting note for work.   Review of Systems See HPI  Past Medical History  Diagnosis Date  . Anemia     iron deficiency  . Depression   . Acid reflux   . Bulging disc     slight  . Ear infection   . Heartburn   . Anxiety   . Bipolar disorder (HCC)     year 2009  . Liver disease     Fatty Liver Disease 2012  . Fatigue   . Tachycardia April 27, 2014  . Constipation   . Diarrhea   . Headache   . OSA (obstructive sleep apnea)     mild  . PCOS (polycystic ovarian syndrome)     Social History   Social History  . Marital Status: Single    Spouse Name: N/A  . Number of Children: N/A  . Years of Education: N/A   Occupational History  . Not on file.   Social History Main Topics  . Smoking status: Never Smoker   . Smokeless tobacco: Never Used  . Alcohol Use: 0.0 oz/week    0 Standard drinks or equivalent per week     Comment: a few times a year- drink 1-2 drinks per episode  . Drug Use: No  . Sexual Activity: Not on file   Other Topics Concern  . Not on file   Social History Narrative   Has 2 dogs and 2 cats   Junior Account   Completed Bachelors   Single, no children   Lives with animales   Born in [place]   ENjoys museums, historical things, movies, friends    Past Surgical History  Procedure Laterality  Date  . Wisdom tooth extraction    . Wisdom tooth extraction    . Colonoscopy    . Upper gi endoscopy    . Cholecystectomy, laparoscopic  07/09/2014  . Cholecystectomy N/A 07/12/2014    Procedure: LAPAROSCOPIC CHOLECYSTECTOMY WITH INTRAOPERATIVE CHOLANGIOGRAM;  Surgeon: Chevis Pretty III, MD;  Location: [facility] OR;  Service: General;  Laterality: N/A;    Family History  Problem Relation Age of Onset  . Cancer Other     breast/ family hx of  . Diabetes Other     family hx of  . Hyperlipidemia Other     family hx of  . Hypertension Other     family hx of  . Thyroid disease Other     family hx of  . Thyroid disease Mother   . GER disease Mother   . Hyperlipidemia Mother   . Other Mother     uterine polyp  . Hypertension Father   . Hyperlipidemia Father   . Diabetes Father     type II  . Gout Father   . Cancer Maternal Aunt  breast cancer 50's  . Drug abuse Maternal Aunt   . Cancer Paternal Uncle     lung  . Heart attack Maternal Grandmother   . Cancer Paternal Grandmother     breast 70's  . Heart disease Paternal Grandfather     heart failure  . Drug abuse Maternal Uncle   . Drug abuse Cousin   . Alcohol abuse Neg Hx   . Anxiety disorder Neg Hx   . Bipolar disorder Neg Hx   . Depression Neg Hx     Allergies  Allergen Reactions  . Iodinated Diagnostic Agents Other (See Comments)    Numbness on right side of body (only from MRI dye)  . Other Other (See Comments)    Vanilla causes migraines  . Risperidone And Related Other (See Comments)    Uncontrollable twitching  . Trazodone And Nefazodone Diarrhea and Other (See Comments)    Severe abdominal cramping, loss of appetite  . Wellbutrin [Bupropion] Other (See Comments)    Elevated BP, tacycardia, insomnia, no appetite (only when taking phentermine)   . Hydromorphone Hcl Itching, Nausea And Vomiting and Rash  . Sulfa Antibiotics Nausea And Vomiting and Rash  . Tape Itching and Rash    Paper tape ok    Current  Outpatient Prescriptions on File Prior to Visit  Medication Sig Dispense Refill  . ALPRAZolam (XANAX) 0.5 MG tablet Take 1 tablet (0.5 mg total) by mouth 2 (two) times daily as needed for anxiety. 60 tablet 0  . amoxicillin (AMOXIL) 500 MG capsule Take 1 capsule (500 mg total) by mouth 2 (two) times daily. 20 capsule 0  . gabapentin (NEURONTIN) 100 MG capsule Take 1 capsule (100 mg total) by mouth 3 (three) times daily. 90 capsule 1  . lithium carbonate (LITHOBID) 300 MG CR tablet Take 4 tablets (1,200 mg total) by mouth daily. 120 tablet 1  . MICROGESTIN FE 1/20 1-20 MG-MCG tablet     . RABEprazole (ACIPHEX) 20 MG tablet Take 1 tablet (20 mg total) by mouth daily. 30 tablet 5  . zolpidem (AMBIEN) 5 MG tablet Take 1 tablet (5 mg total) by mouth at bedtime as needed for sleep. 30 tablet 0   No current facility-administered medications on file prior to visit.    BP 128/86 mmHg  Pulse 77  Temp(Src) 98.6 F (37 C) (Oral)  Resp 16  Ht 5' (1.524 m)  Wt 180 lb 6.4 oz (81.829 kg)  BMI 35.23 kg/m2  SpO2 98%       Objective:   Physical Exam  Constitutional: She appears well-developed and well-nourished.  HENT:  Head: Normocephalic and atraumatic.  L TM- some clear fluid noted behind TM R TM- pink at 2 oclock.   R ear canal, some mild erythema/scabbing adjacent to the R TM.   Eyes: Conjunctivae are normal.  Cardiovascular: Normal rate, regular rhythm and normal heart sounds.   No murmur heard. Pulmonary/Chest: Effort normal and breath sounds normal. No respiratory distress. She has no wheezes.  Psychiatric: She has a normal mood and affect. Her behavior is normal. Judgment and thought content normal.          Assessment & Plan:  Otitis Media- Clinically unchanged. Suspect mild Otitis externa as well. D/C amoxicillin, begin Augmentin. Add cortisporin otic drops.  Note provided for work.  Discussed dizziness is likely related to middle ear effusion and should improve as OM resolves.

## 2015-08-26 NOTE — Progress Notes (Signed)
Pre visit review using our clinic review tool, if applicable. No additional management support is needed unless otherwise documented below in the visit note. 

## 2015-08-26 NOTE — Patient Instructions (Signed)
Stop amoxicillin. Start augmentin and cortisporin otic drops to your right ear.  Call if symptoms worsen or if symptoms are not improved in 1 week.

## 2015-08-27 ENCOUNTER — Ambulatory Visit (HOSPITAL_COMMUNITY): Payer: Self-pay | Admitting: Psychiatry

## 2015-09-24 ENCOUNTER — Encounter (HOSPITAL_COMMUNITY): Payer: Self-pay | Admitting: Psychiatry

## 2015-09-24 ENCOUNTER — Ambulatory Visit (INDEPENDENT_AMBULATORY_CARE_PROVIDER_SITE_OTHER): Payer: Commercial Managed Care - PPO | Admitting: Psychiatry

## 2015-09-24 VITALS — BP 112/78 | HR 88 | Ht 60.0 in | Wt 179.8 lb

## 2015-09-24 DIAGNOSIS — F410 Panic disorder [episodic paroxysmal anxiety] without agoraphobia: Secondary | ICD-10-CM | POA: Diagnosis not present

## 2015-09-24 DIAGNOSIS — F331 Major depressive disorder, recurrent, moderate: Secondary | ICD-10-CM | POA: Diagnosis not present

## 2015-09-24 DIAGNOSIS — F4010 Social phobia, unspecified: Secondary | ICD-10-CM | POA: Diagnosis not present

## 2015-09-24 DIAGNOSIS — F4310 Post-traumatic stress disorder, unspecified: Secondary | ICD-10-CM

## 2015-09-24 MED ORDER — PAROXETINE HCL 20 MG PO TABS: 20.0000 mg | ORAL_TABLET | Freq: Every day | ORAL | Status: DC

## 2015-09-24 MED ORDER — GABAPENTIN 100 MG PO CAPS: 200.0000 mg | ORAL_CAPSULE | Freq: Three times a day (TID) | ORAL | Status: DC
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
## 2015-11-13 HISTORY — PX: CHOLESTEATOMA EXCISION: SHX1345

## 2015-12-05 ENCOUNTER — Ambulatory Visit (HOSPITAL_COMMUNITY): Payer: Self-pay | Admitting: Psychiatry

## 2016-02-06 ENCOUNTER — Ambulatory Visit (HOSPITAL_COMMUNITY): Payer: Self-pay | Admitting: Psychiatry

## 2016-02-20 ENCOUNTER — Ambulatory Visit (INDEPENDENT_AMBULATORY_CARE_PROVIDER_SITE_OTHER): Payer: Commercial Managed Care - PPO | Admitting: Family Medicine

## 2016-02-20 ENCOUNTER — Encounter: Payer: Self-pay | Admitting: Family Medicine

## 2016-02-20 VITALS — BP 150/85 | HR 64 | Wt 177.0 lb

## 2016-02-20 DIAGNOSIS — Z30430 Encounter for insertion of intrauterine contraceptive device: Secondary | ICD-10-CM

## 2016-02-20 DIAGNOSIS — Z01812 Encounter for preprocedural laboratory examination: Secondary | ICD-10-CM | POA: Diagnosis not present

## 2016-02-20 DIAGNOSIS — Z3202 Encounter for pregnancy test, result negative: Secondary | ICD-10-CM

## 2016-02-20 LAB — POCT URINE PREGNANCY: Preg Test, Ur: NEGATIVE

## 2016-02-20 NOTE — Progress Notes (Signed)
IUD Procedure Note Patient identified, informed consent performed, signed copy in chart, time out was performed.  Urine pregnancy test negative.  Speculum placed in the vagina.  Cervix visualized.  Cleaned with Betadine x 2.  Grasped anteriorly with a single tooth tenaculum.  Uterus sounded to 8 cm.  Liletta  IUD placed per manufacturer's recommendations.  Strings trimmed to 3 cm. Tenaculum was removed, good hemostasis noted.  Patient tolerated procedure well.   Patient given post procedure instructions and Liletta care card with expiration date.  Patient is asked to check IUD strings periodically and follow up in 4-6 weeks for IUD check.  

## 2016-02-20 NOTE — Patient Instructions (Signed)

## 2016-03-04 ENCOUNTER — Ambulatory Visit (INDEPENDENT_AMBULATORY_CARE_PROVIDER_SITE_OTHER): Payer: Commercial Managed Care - PPO | Admitting: Family

## 2016-03-04 ENCOUNTER — Encounter: Payer: Self-pay | Admitting: Family

## 2016-03-04 VITALS — BP 138/95 | HR 80 | Temp 98.9°F | Resp 18 | Ht 60.0 in | Wt 176.6 lb

## 2016-03-04 DIAGNOSIS — J029 Acute pharyngitis, unspecified: Secondary | ICD-10-CM

## 2016-03-04 DIAGNOSIS — I10 Essential (primary) hypertension: Secondary | ICD-10-CM | POA: Diagnosis not present

## 2016-03-04 LAB — POCT RAPID STREP A (OFFICE): RAPID STREP A SCREEN: POSITIVE — AB

## 2016-03-04 MED ORDER — AMOXICILLIN 500 MG PO CAPS: 500.0000 mg | ORAL_CAPSULE | Freq: Three times a day (TID) | ORAL | 0 refills | Status: DC

## 2016-03-04 MED ORDER — METOPROLOL SUCCINATE ER 25 MG PO TB24: 25.0000 mg | ORAL_TABLET | Freq: Every day | ORAL | 2 refills | Status: DC

## 2016-03-04 NOTE — Progress Notes (Signed)
Pre visit review using our clinic review tool, if applicable. No additional management support is needed unless otherwise documented below in the visit note. 

## 2016-03-04 NOTE — Patient Instructions (Addendum)
Please begin amoxicillin for strep. Begin toprol xl once daily for your blood pressure. Work on a low sodium diet.

## 2016-03-04 NOTE — Progress Notes (Signed)
Subjective:    Patient ID: Rhonda Tate, female    DOB: [birth year], 29 y.o.   MRN: [id number]  HPI  [name] is a 30 yr old female who presents today with c/o sore throat and left ear pain. 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  Sore throat began last night. Denies associated fever or cough.  Left ear pain began 2-3 days ago.    Elevated blood pressure- has been experiencing daily headaches.    BP Readings from Last 3 Encounters:  03/04/16 (!) 138/95  02/20/16 (!) 150/85  08/26/15 128/86     Review of Systems See HPI  Past Medical History:  Diagnosis Date  . Acid reflux   . Anemia    iron deficiency  . Anxiety   . Bipolar disorder (HCC)    year 2009  . Bulging disc    slight  . Constipation   . Depression   . Diarrhea   . Ear infection   . Fatigue   . Headache   . Heartburn   . Liver disease    Fatty Liver Disease 2012  . OSA (obstructive sleep apnea)    mild  . PCOS (polycystic ovarian syndrome)   . Retracted ear drum   . Tachycardia April 27, 2014     Social History   Social History  . Marital status: Single    Spouse name: N/A  . Number of children: N/A  . Years of education: N/A   Occupational History  . Not on file.   Social History Main Topics  . Smoking status: Never Smoker  . Smokeless tobacco: Never Used  . Alcohol use 0.0 oz/week     Comment: a few times a year- drink 1-2 drinks per episode  . Drug use: No  . Sexual activity: Not Currently    Birth control/ protection: Injection, IUD   Other Topics Concern  . Not on file   Social History Narrative   Has 2 dogs and 2 cats   Junior Account   Completed Bachelors   Single, no children   Lives with animales   Born in [place]   ENjoys museums, historical things, movies, friends    Past Surgical History:  Procedure Laterality Date  . CHOLECYSTECTOMY N/A 07/12/2014   Procedure: LAPAROSCOPIC CHOLECYSTECTOMY WITH INTRAOPERATIVE  CHOLANGIOGRAM;  Surgeon: Chevis Pretty III, MD;  Location: [facility] OR;  Service: General;  Laterality: N/A;  . CHOLECYSTECTOMY, LAPAROSCOPIC  07/09/2014  . CHOLESTEATOMA EXCISION Right [facility]/13/2015   [facility]  . COLONOSCOPY    . TYMPANOSTOMY TUBE PLACEMENT Right   . UPPER GI ENDOSCOPY    . WISDOM TOOTH EXTRACTION    . WISDOM TOOTH EXTRACTION      Family History  Problem Relation Age of Onset  . Thyroid disease Mother   . GER disease Mother   . Hyperlipidemia Mother   . Other Mother     uterine polyp  . Hypertension Father   . Hyperlipidemia Father   . Diabetes Father     type II  . Gout Father   . Cancer Maternal Aunt     breast cancer 50's  . Drug abuse Maternal Aunt   . Cancer Paternal Uncle     lung  . Heart attack Maternal Grandmother   . Cancer Paternal Grandmother     breast 70's  . Heart disease Paternal Grandfather     heart failure  . Cancer Other  breast/ family hx of  . Diabetes Other     family hx of  . Hyperlipidemia Other     family hx of  . Hypertension Other     family hx of  . Thyroid disease Other     family hx of  . Drug abuse Maternal Uncle   . Drug abuse Cousin   . Alcohol abuse Neg Hx   . Anxiety disorder Neg Hx   . Bipolar disorder Neg Hx   . Depression Neg Hx     Allergies  Allergen Reactions  . Iodinated Diagnostic Agents Other (See Comments)    Numbness on right side of body (only from MRI dye)  . Other Other (See Comments)    Vanilla causes migraines  . Risperidone And Related Other (See Comments)    Uncontrollable twitching  . Trazodone And Nefazodone Diarrhea and Other (See Comments)    Severe abdominal cramping, loss of appetite  . Wellbutrin [Bupropion] Other (See Comments)    Elevated BP, tacycardia, insomnia, no appetite (only when taking phentermine)   . Hydromorphone Hcl Itching, Nausea And Vomiting and Rash  . Sulfa Antibiotics Nausea And Vomiting and Rash  . Tape Itching and Rash    Paper tape ok    Current Outpatient  Prescriptions on File Prior to Visit  Medication Sig Dispense Refill  . gabapentin (NEURONTIN) 100 MG capsule Take 2 capsules (200 mg total) by mouth 3 (three) times daily. 180 capsule 1  . PARoxetine (PAXIL) 20 MG tablet Take 1 tablet (20 mg total) by mouth daily. 30 tablet 1   No current facility-administered medications on file prior to visit.     BP (!) 138/95 (BP Location: Right Arm, Cuff Size: Large)   Pulse 80   Temp 98.9 F (37.2 C) (Oral)   Resp 18   Ht 5' (1.524 m)   Wt 176 lb 9.6 oz (80.1 kg)   LMP 02/27/2016   SpO2 100% Comment: room air  BMI 34.49 kg/m       Objective:   Physical Exam  Constitutional: She appears well-developed and well-nourished.  HENT:  Mouth/Throat: Posterior oropharyngeal erythema present. No oropharyngeal exudate or posterior oropharyngeal edema.  R tympanostomy tube L TM normal without erythema  L tonsil stone noted  Cardiovascular: Normal rate, regular rhythm and normal heart sounds.   No murmur heard. Pulmonary/Chest: Effort normal and breath sounds normal. No respiratory distress. She has no wheezes.  Psychiatric: She has a normal mood and affect. Her behavior is normal. Judgment and thought content normal.          Assessment & Plan:  Strep pharyngitis- New. will rx with amoxicillin.  Advised pt to complete course and call if symptoms worsen or do not improve.

## 2016-03-04 NOTE — Assessment & Plan Note (Signed)
Will begin Toprol xl to help with BP and also hopefully to help prevent migraine.

## 2016-03-19 ENCOUNTER — Ambulatory Visit: Payer: Self-pay | Admitting: Family Medicine

## 2016-04-01 ENCOUNTER — Encounter: Payer: Self-pay | Admitting: Family Medicine

## 2016-04-01 ENCOUNTER — Ambulatory Visit (INDEPENDENT_AMBULATORY_CARE_PROVIDER_SITE_OTHER): Payer: Commercial Managed Care - PPO | Admitting: Family Medicine

## 2016-04-01 VITALS — BP 140/88 | HR 69 | Ht 59.5 in | Wt 176.0 lb

## 2016-04-01 DIAGNOSIS — Z6835 Body mass index (BMI) 35.0-35.9, adult: Secondary | ICD-10-CM

## 2016-04-01 DIAGNOSIS — Z30431 Encounter for routine checking of intrauterine contraceptive device: Secondary | ICD-10-CM | POA: Diagnosis not present

## 2016-04-01 DIAGNOSIS — E669 Obesity, unspecified: Secondary | ICD-10-CM | POA: Diagnosis not present

## 2016-04-01 NOTE — Progress Notes (Signed)
   Subjective:   Patient Name: Rhonda SnareJessica E Griffitts, female   DOB: [birth year], 30 y.o.  MRN: [id number]  HPI Patient here for an IUD check.  She had the liletta IUD placed 1 month ago.  She reports occasional cramping. Had some light spotting a few days ago and today. She is due to start her period soon.  She also has concerns regarding PCO as. The previous OB/GYN had performed an ultrasound, which showed polycystic ovaries. She also had labs done, although she does not know the results. Recommended tibial requested, however we have not been able to receive it. Patient's main concern is that she has fatty liver disease and has had difficulty losing weight.   Review of Systems  Constitutional: Negative for fever and chills.  Gastrointestinal: Negative for abdominal pain.  Genitourinary: Negative for vaginal discharge, vaginal pain, pelvic pain and dyspareunia.       Objective:   Physical Exam  Constitutional: She appears well-developed and well-nourished.  HENT:  Head: Normocephalic and atraumatic.  Abdominal: Soft. There is no tenderness. There is no guarding.  Genitourinary: There is no rash, tenderness or lesion on the right labia. There is no rash, tenderness or lesion on the left labia. No erythema or tenderness in the vagina. No foreign body around the vagina. No signs of injury around the vagina. No vaginal discharge found.    Skin: Skin is warm and dry.  Psychiatric: She has a normal mood and affect. Her behavior is normal. Judgment and thought content normal.      Assessment & Plan:  1. IUD check up IUD in place.  Pt to call with any other problems.  Recheck in 1 year.  2. Obesity Unsure about PCO as. Will request records again. Problem list lists low testosterone, which would rule against PCOS. In terms of weight loss, patient could be referred to bariatric center for additional help. Patient to discuss this with her PCP.

## 2016-04-03 ENCOUNTER — Ambulatory Visit (INDEPENDENT_AMBULATORY_CARE_PROVIDER_SITE_OTHER): Payer: Commercial Managed Care - PPO | Admitting: Family

## 2016-04-03 ENCOUNTER — Encounter: Payer: Self-pay | Admitting: Family

## 2016-04-03 VITALS — BP 130/90 | HR 81 | Temp 98.2°F | Resp 18 | Ht 65.0 in | Wt 174.4 lb

## 2016-04-03 DIAGNOSIS — E663 Overweight: Secondary | ICD-10-CM | POA: Diagnosis not present

## 2016-04-03 DIAGNOSIS — I10 Essential (primary) hypertension: Secondary | ICD-10-CM

## 2016-04-03 MED ORDER — METOPROLOL SUCCINATE ER 50 MG PO TB24: 50.0000 mg | ORAL_TABLET | Freq: Every day | ORAL | 2 refills | Status: AC

## 2016-04-03 NOTE — Patient Instructions (Addendum)
Please continue to work on healthy diet and exercise. You will be contacted about your referral to nutrition. Contact [facility] to see if they cover any weight loss drugs such as (Belviq, [place]axenda, Contrave), then let me know and we can send an rx in for you.  Please increase your Metoprolol from 25mg  to 50mg  once daily.

## 2016-04-03 NOTE — Progress Notes (Signed)
Subjective:    Patient ID: Rhonda Tate, female    DOB: 17-Jun-1985, 30 y.o.   MRN: [id number]  HPI   Rhonda Tate is a 30 yr old female who presents today for follow up.  1) HTN-  Reports that her home BP's have been 130-140's/ mid to upper 80's.  She is maintained on Toprol xl 25mg  once daily.   BP Readings from Last 3 Encounters:  04/03/16 130/90  04/01/16 140/88  03/04/16 (!) 138/95    2) Overweight-  Reports that she has been unsuccessful in keeping weight off.  Has tried multiple diets including Earheart healthy weight loss.   Wt Readings from Last 3 Encounters:  04/03/16 174 lb 6.4 oz (79.1 kg)  04/01/16 176 lb (79.8 kg)  03/04/16 176 lb 9.6 oz (80.1 kg)    Review of Systems See HPI  Past Medical History:  Diagnosis Date  . Acid reflux   . Anemia    iron deficiency  . Anxiety   . Bipolar disorder (HCC)    year 2009  . Bulging disc    slight  . Constipation   . Depression   . Diarrhea   . Ear infection   . Fatigue   . Headache   . Heartburn   . Liver disease    Fatty Liver Disease 2012  . OSA (obstructive sleep apnea)    mild  . PCOS (polycystic ovarian syndrome)   . Retracted ear drum   . Tachycardia April 27, 2014     Social History   Social History  . Marital status: Single    Spouse name: N/A  . Number of children: N/A  . Years of education: N/A   Occupational History  . Not on file.   Social History Main Topics  . Smoking status: Never Smoker  . Smokeless tobacco: Never Used  . Alcohol use 0.0 oz/week     Comment: a few times a year- drink 1-2 drinks per episode  . Drug use: No  . Sexual activity: Not Currently    Birth control/ protection: Injection, IUD   Other Topics Concern  . Not on file   Social History Narrative   Has 2 dogs and 2 cats   Junior Account   Completed Bachelors   Single, no children   Lives with animales   Born in [place]   ENjoys museums, historical things, movies, friends    Past  Surgical History:  Procedure Laterality Date  . CHOLECYSTECTOMY N/A 07/12/2014   Procedure: LAPAROSCOPIC CHOLECYSTECTOMY WITH INTRAOPERATIVE CHOLANGIOGRAM;  Surgeon: Chevis Pretty III, MD;  Location: [facility] OR;  Service: General;  Laterality: N/A;  . CHOLECYSTECTOMY, LAPAROSCOPIC  07/09/2014  . CHOLESTEATOMA EXCISION Right 11/13/2015   [facility]  . COLONOSCOPY    . TYMPANOSTOMY TUBE PLACEMENT Right   . UPPER GI ENDOSCOPY    . WISDOM TOOTH EXTRACTION    . WISDOM TOOTH EXTRACTION      Family History  Problem Relation Age of Onset  . Thyroid disease Mother   . GER disease Mother   . Hyperlipidemia Mother   . Other Mother     uterine polyp  . Hypertension Father   . Hyperlipidemia Father   . Diabetes Father     type II  . Gout Father   . Cancer Maternal Aunt     breast cancer 50's  . Drug abuse Maternal Aunt   . Cancer Paternal Uncle     lung  . Heart attack Maternal  Grandmother   . Cancer Paternal Grandmother     breast 70's  . Heart disease Paternal Grandfather     heart failure  . Cancer Other     breast/ family hx of  . Diabetes Other     family hx of  . Hyperlipidemia Other     family hx of  . Hypertension Other     family hx of  . Thyroid disease Other     family hx of  . Drug abuse Maternal Uncle   . Drug abuse Cousin   . Alcohol abuse Neg Hx   . Anxiety disorder Neg Hx   . Bipolar disorder Neg Hx   . Depression Neg Hx     Allergies  Allergen Reactions  . Iodinated Diagnostic Agents Other (See Comments)    Numbness on right side of body (only from MRI dye)  . Other Other (See Comments)    Vanilla causes migraines  . Risperidone And Related Other (See Comments)    Uncontrollable twitching  . Trazodone And Nefazodone Diarrhea and Other (See Comments)    Severe abdominal cramping, loss of appetite  . Wellbutrin [Bupropion] Other (See Comments)    Elevated BP, tacycardia, insomnia, no appetite (only when taking phentermine)   . Hydromorphone Hcl Itching, Nausea  And Vomiting and Rash  . Sulfa Antibiotics Nausea And Vomiting and Rash  . Tape Itching and Rash    Paper tape ok    Current Outpatient Prescriptions on File Prior to Visit  Medication Sig Dispense Refill  . aspirin-acetaminophen-caffeine (EXCEDRIN MIGRAINE) 250-250-65 MG tablet Take 2 tablets by mouth daily as needed for headache.    . cetirizine (ZYRTEC) 10 MG tablet Take 10 mg by mouth daily.    Marland Kitchen. gabapentin (NEURONTIN) 100 MG capsule Take 2 capsules (200 mg total) by mouth 3 (three) times daily. 180 capsule 1  . metoprolol succinate (TOPROL-XL) 25 MG 24 hr tablet Take 1 tablet (25 mg total) by mouth daily. 30 tablet 2  . omeprazole (PRILOSEC) 20 MG capsule Take 20 mg by mouth daily.    Marland Kitchen. PARoxetine (PAXIL) 20 MG tablet Take 1 tablet (20 mg total) by mouth daily. 30 tablet 1   No current facility-administered medications on file prior to visit.     BP 130/90   Pulse 81   Temp 98.2 F (36.8 C) (Oral)   Resp 18   Ht 5\' 5"  (1.651 m)   Wt 174 lb 6.4 oz (79.1 kg)   LMP 02/22/2016   SpO2 98% Comment: room air  BMI 29.02 kg/m       Objective:   Physical Exam  Constitutional: She appears well-developed and well-nourished.  Cardiovascular: Normal rate, regular rhythm and normal heart sounds.   No murmur heard. Pulmonary/Chest: Effort normal and breath sounds normal. No respiratory distress. She has no wheezes.  Psychiatric: She has a normal mood and affect. Her behavior is normal. Judgment and thought content normal.          Assessment & Plan:  Declines flu shot.

## 2016-04-03 NOTE — Assessment & Plan Note (Signed)
Uncontrolled. Will increase toprol xl from 25mg  to 50mg  once daily.

## 2016-04-03 NOTE — Progress Notes (Signed)
Pre visit review using our clinic review tool, if applicable. No additional management support is needed unless otherwise documented below in the visit note. 

## 2016-04-23 ENCOUNTER — Ambulatory Visit (HOSPITAL_COMMUNITY): Payer: Self-pay | Admitting: Psychiatry

## 2016-05-04 ENCOUNTER — Ambulatory Visit: Payer: Commercial Managed Care - PPO | Admitting: Family

## 2016-05-04 ENCOUNTER — Encounter: Payer: Self-pay | Admitting: Family Medicine

## 2016-05-04 ENCOUNTER — Telehealth: Payer: Self-pay | Admitting: Family

## 2016-05-04 ENCOUNTER — Encounter: Payer: Self-pay | Admitting: Family

## 2016-05-04 NOTE — Telephone Encounter (Signed)
No charge. 

## 2016-05-04 NOTE — Telephone Encounter (Signed)
Patient lvm 05/01/16 11:32am cancelling 05/04/16 at 8am follow up appointment, charge or no charge

## 2016-05-07 ENCOUNTER — Telehealth: Payer: Self-pay | Admitting: Obstetrics and Gynecology

## 2016-05-07 NOTE — Telephone Encounter (Signed)
Called patient to schedule an appointment. She thought she was calling the [facility] office. Patient stated she would call the office in the morning.

## 2016-05-12 ENCOUNTER — Encounter: Payer: Self-pay | Admitting: Family

## 2016-05-12 ENCOUNTER — Ambulatory Visit (INDEPENDENT_AMBULATORY_CARE_PROVIDER_SITE_OTHER): Payer: Commercial Managed Care - PPO | Admitting: Family

## 2016-05-12 VITALS — BP 120/82 | HR 75 | Temp 98.2°F | Resp 16 | Ht 60.0 in | Wt 173.0 lb

## 2016-05-12 DIAGNOSIS — I10 Essential (primary) hypertension: Secondary | ICD-10-CM | POA: Diagnosis not present

## 2016-05-12 DIAGNOSIS — H9201 Otalgia, right ear: Secondary | ICD-10-CM

## 2016-05-12 NOTE — Progress Notes (Signed)
Pre visit review using our clinic review tool, if applicable. No additional management support is needed unless otherwise documented below in the visit note. pao

## 2016-05-12 NOTE — Assessment & Plan Note (Signed)
Stable on current medications, continue same.  

## 2016-05-12 NOTE — Progress Notes (Signed)
Subjective:    Patient ID: Rhonda Tate, female    DOB: 1986/01/10, 30 y.o.   MRN: [id number]  HPI  Rhonda Tate is a 30 yr old female who presents today for follow up.  HTN-  Toprol was increased from 25mg  to 50mg .  Denies CP/SOB/swelling.  BP Readings from Last 3 Encounters:  05/12/16 120/82  04/03/16 130/90  04/01/16 140/88   Right ear pain- Reports + ear pain for several days. Had right typmpanostomy tube placement in June.    Review of Systems    see HPI  Past Medical History:  Diagnosis Date  . Acid reflux   . Anemia    iron deficiency  . Anxiety   . Bipolar disorder (HCC)    year 2009  . Bulging disc    slight  . Constipation   . Depression   . Diarrhea   . Ear infection   . Fatigue   . Headache   . Heartburn   . Liver disease    Fatty Liver Disease 2012  . OSA (obstructive sleep apnea)    mild  . PCOS (polycystic ovarian syndrome)   . Retracted ear drum   . Tachycardia April 27, 2014     Social History   Social History  . Marital status: Single    Spouse name: N/A  . Number of children: N/A  . Years of education: N/A   Occupational History  . Not on file.   Social History Main Topics  . Smoking status: Never Smoker  . Smokeless tobacco: Never Used  . Alcohol use 0.0 oz/week     Comment: a few times a year- drink 1-2 drinks per episode  . Drug use: No  . Sexual activity: Not Currently    Birth control/ protection: Injection, IUD   Other Topics Concern  . Not on file   Social History Narrative   Has 2 dogs and 2 cats   [employer]   Completed Bachelors   Single, no children   Lives with animales   [place]   ENjoys museums, historical things, movies, friends    Past Surgical History:  Procedure Laterality Date  . CHOLECYSTECTOMY N/A 07/12/2014   Procedure: LAPAROSCOPIC CHOLECYSTECTOMY WITH INTRAOPERATIVE CHOLANGIOGRAM;  Surgeon: Chevis PrettyPaul Toth III, MD;  Location: [facility] OR;  Service: General;  Laterality: N/A;  .  CHOLECYSTECTOMY, LAPAROSCOPIC  07/09/2014  . CHOLESTEATOMA EXCISION Right 11/13/2015   [facility]  . COLONOSCOPY    . TYMPANOSTOMY TUBE PLACEMENT Right   . UPPER GI ENDOSCOPY    . WISDOM TOOTH EXTRACTION    . WISDOM TOOTH EXTRACTION      Family History  Problem Relation Age of Onset  . Thyroid disease Mother   . GER disease Mother   . Hyperlipidemia Mother   . Other Mother     uterine polyp  . Hypertension Father   . Hyperlipidemia Father   . Diabetes Father     type II  . Gout Father   . Cancer Maternal Aunt     breast cancer 50's  . Drug abuse Maternal Aunt   . Cancer Paternal Uncle     lung  . Heart attack Maternal Grandmother   . Cancer Paternal Grandmother     breast 70's  . Heart disease Paternal Grandfather     heart failure  . Cancer Other     breast/ family hx of  . Diabetes Other     family hx of  .  Hyperlipidemia Other     family hx of  . Hypertension Other     family hx of  . Thyroid disease Other     family hx of  . Drug abuse Maternal Uncle   . Drug abuse Cousin   . Alcohol abuse Neg Hx   . Anxiety disorder Neg Hx   . Bipolar disorder Neg Hx   . Depression Neg Hx     Allergies  Allergen Reactions  . Iodinated Diagnostic Agents Other (See Comments)    Numbness on right side of body (only from MRI dye)  . Other Other (See Comments)    Vanilla causes migraines  . Risperidone And Related Other (See Comments)    Uncontrollable twitching  . Trazodone And Nefazodone Diarrhea and Other (See Comments)    Severe abdominal cramping, loss of appetite  . Wellbutrin [Bupropion] Other (See Comments)    Elevated BP, tacycardia, insomnia, no appetite (only when taking phentermine)   . Hydromorphone Hcl Itching, Nausea And Vomiting and Rash  . Sulfa Antibiotics Nausea And Vomiting and Rash  . Tape Itching and Rash    Paper tape ok    Current Outpatient Prescriptions on File Prior to Visit  Medication Sig Dispense Refill  .  aspirin-acetaminophen-caffeine (EXCEDRIN MIGRAINE) 250-250-65 MG tablet Take 2 tablets by mouth daily as needed for headache.    . cetirizine (ZYRTEC) 10 MG tablet Take 10 mg by mouth daily.    Marland Kitchen. gabapentin (NEURONTIN) 100 MG capsule Take 2 capsules (200 mg total) by mouth 3 (three) times daily. 180 capsule 1  . metoprolol succinate (TOPROL-XL) 50 MG 24 hr tablet Take 1 tablet (50 mg total) by mouth daily. Take with or immediately following a meal. 30 tablet 2  . omeprazole (PRILOSEC) 20 MG capsule Take 20 mg by mouth daily.    Marland Kitchen. PARoxetine (PAXIL) 20 MG tablet Take 1 tablet (20 mg total) by mouth daily. 30 tablet 1   No current facility-administered medications on file prior to visit.     BP 120/82 (BP Location: Right Arm, Cuff Size: Large)   Pulse 75   Temp 98.2 F (36.8 C) (Oral)   Resp 16   Ht 5\' 5"  (1.651 m)   Wt 173 lb (78.5 kg)   LMP 04/19/2016   SpO2 100%   BMI 28.79 kg/m    Objective:   Physical Exam  Constitutional: She is oriented to person, place, and time. She appears well-developed and well-nourished. No distress.  HENT:  Head: Normocephalic and atraumatic.  Right Ear: Ear canal normal.  Left Ear: Tympanic membrane and ear canal normal.  Right myringotomy tube is intact, surrounding R TM appears normal.  No drainage is noted  Cardiovascular: Normal rate and regular rhythm.   No murmur heard. Pulmonary/Chest: Effort normal and breath sounds normal. No respiratory distress. She has no wheezes. She has no rales. She exhibits no tenderness.  Lymphadenopathy:    She has no cervical adenopathy.  Neurological: She is alert and oriented to person, place, and time.          Assessment & Plan:  Otalgia- R myringotomy tube is intact. Reassurance provided- advised pt to follow up with ENT if symptoms worsen or do not improve.

## 2016-05-13 ENCOUNTER — Encounter: Payer: Self-pay | Admitting: Family Medicine

## 2016-05-13 ENCOUNTER — Ambulatory Visit (INDEPENDENT_AMBULATORY_CARE_PROVIDER_SITE_OTHER): Payer: Commercial Managed Care - PPO | Admitting: Family Medicine

## 2016-05-13 VITALS — Ht 60.0 in | Wt 173.0 lb

## 2016-05-13 DIAGNOSIS — Z3042 Encounter for surveillance of injectable contraceptive: Secondary | ICD-10-CM | POA: Diagnosis not present

## 2016-05-13 DIAGNOSIS — Z30432 Encounter for removal of intrauterine contraceptive device: Secondary | ICD-10-CM | POA: Diagnosis not present

## 2016-05-13 DIAGNOSIS — Z30013 Encounter for initial prescription of injectable contraceptive: Secondary | ICD-10-CM | POA: Diagnosis not present

## 2016-05-13 DIAGNOSIS — N898 Other specified noninflammatory disorders of vagina: Secondary | ICD-10-CM

## 2016-05-13 MED ORDER — MEDROXYPROGESTERONE ACETATE 150 MG/ML IM SUSP
150.0000 mg | Freq: Once | INTRAMUSCULAR | Status: AC
  Administered 2016-05-13: 150 mg via INTRAMUSCULAR

## 2016-05-13 NOTE — Progress Notes (Signed)
   Subjective:    Patient ID: Rhonda Tate, female    DOB: 01-29-1986, 30 y.o.   MRN: [id number]  HPI Patient having cramping x4 weeks and bleeding for 3 weeks. Has foul vaginal odor as well. Would like IUD taken out and would like to start depo.   Review of Systems     Objective:   Physical Exam  Constitutional: She appears well-developed and well-nourished.  Abdominal: Soft. Bowel sounds are normal. She exhibits no distension. There is no tenderness. There is no rebound and no guarding.  Genitourinary: There is no rash, tenderness or lesion on the right labia. There is no rash, tenderness or lesion on the left labia. No vaginal discharge found.            Assessment & Plan:  1. Encounter for IUD removal Discussed that could try provera to suppress bleeding. Patient preferred IUD to be removed. Will start on depo.  2. Vaginal discharge - WET PREP BY MOLECULAR PROBE    IUD Removal  Patient was in the dorsal lithotomy position, normal external genitalia was noted.  A speculum was placed in the patient's vagina, normal discharge was noted, no lesions. The multiparous cervix was visualized, no lesions, no abnormal discharge,  and was swabbed with Betadine using scopettes.  The strings of the IUD was grasped and pulled using ring forceps.  The IUD was successfully removed in its entirety.  Patient tolerated the procedure well.

## 2016-05-13 NOTE — Progress Notes (Signed)
Patient given Depo Provera injection and told to come back in three months for next injection. Armandina StammerJennifer Sonny Anthes RNBSN

## 2016-05-14 ENCOUNTER — Telehealth: Payer: Self-pay

## 2016-05-14 ENCOUNTER — Other Ambulatory Visit: Payer: Self-pay | Admitting: Family Medicine

## 2016-05-14 LAB — WET PREP BY MOLECULAR PROBE
CANDIDA SPECIES: NEGATIVE
GARDNERELLA VAGINALIS: POSITIVE — AB
Trichomonas vaginosis: NEGATIVE

## 2016-05-14 MED ORDER — METRONIDAZOLE 500 MG PO TABS: 500.0000 mg | ORAL_TABLET | Freq: Two times a day (BID) | ORAL | 0 refills | Status: DC

## 2016-05-14 NOTE — Telephone Encounter (Signed)
Left message for patient to return call to office in regards to results. Jennifer Howard RN BSN 

## 2016-05-14 NOTE — Telephone Encounter (Signed)
Patient called back and given results of bacterial vaginosis. Patient made aware that we have called in antibiotic to her pharmacy. Armandina StammerJennifer Howard RNBSN

## 2016-06-09 ENCOUNTER — Ambulatory Visit (HOSPITAL_COMMUNITY): Payer: Self-pay | Admitting: Psychiatry

## 2016-07-13 ENCOUNTER — Ambulatory Visit (INDEPENDENT_AMBULATORY_CARE_PROVIDER_SITE_OTHER): Payer: Commercial Managed Care - PPO

## 2016-07-13 DIAGNOSIS — Z3042 Encounter for surveillance of injectable contraceptive: Secondary | ICD-10-CM

## 2016-07-13 MED ORDER — MEDROXYPROGESTERONE ACETATE 150 MG/ML IM SUSP: 150.0000 mg | Freq: Once | INTRAMUSCULAR | 2 refills | Status: AC

## 2016-07-13 MED ORDER — MEDROXYPROGESTERONE ACETATE 150 MG/ML IM SUSP
150.0000 mg | Freq: Once | INTRAMUSCULAR | Status: AC
  Administered 2016-07-13: 150 mg via INTRAMUSCULAR

## 2016-07-13 NOTE — Progress Notes (Signed)
Patient tolerated injection well. Explained to patient that we will send in her Rx for her next Depo Provera to her pharmacy and she will need to pick it up before next appointment. Armandina StammerJennifer Howard RNBSN

## 2016-07-14 ENCOUNTER — Ambulatory Visit (HOSPITAL_COMMUNITY): Payer: Self-pay | Admitting: Psychiatry

## 2016-07-20 ENCOUNTER — Ambulatory Visit (INDEPENDENT_AMBULATORY_CARE_PROVIDER_SITE_OTHER): Payer: Commercial Managed Care - PPO | Admitting: Medical

## 2016-07-20 ENCOUNTER — Other Ambulatory Visit (HOSPITAL_COMMUNITY)
Admission: RE | Admit: 2016-07-20 | Discharge: 2016-07-20 | Disposition: A | Discharge status: Home / Self Care | Payer: Commercial Managed Care - PPO | Source: Ambulatory Visit | Attending: Medical | Admitting: Medical

## 2016-07-20 ENCOUNTER — Encounter: Payer: Self-pay | Admitting: Medical

## 2016-07-20 VITALS — BP 130/89 | HR 82 | Temp 98.3°F | Resp 16 | Ht 60.0 in | Wt 165.0 lb

## 2016-07-20 DIAGNOSIS — M545 Low back pain, unspecified: Secondary | ICD-10-CM

## 2016-07-20 DIAGNOSIS — R399 Unspecified symptoms and signs involving the genitourinary system: Secondary | ICD-10-CM

## 2016-07-20 LAB — POC URINALSYSI DIPSTICK (AUTOMATED)
BILIRUBIN UA: NEGATIVE
Glucose, UA: NEGATIVE
KETONES UA: NEGATIVE
Nitrite, UA: NEGATIVE
PH UA: 6
PROTEIN UA: POSITIVE
RBC UA: NEGATIVE
Spec Grav, UA: >=1.03
Urobilinogen, UA: 0.2

## 2016-07-20 MED ORDER — PHENAZOPYRIDINE HCL 200 MG PO TABS: 200.0000 mg | ORAL_TABLET | Freq: Three times a day (TID) | ORAL | 0 refills | Status: AC | PRN

## 2016-07-20 MED ORDER — CIPROFLOXACIN HCL 500 MG PO TABS: 500.0000 mg | ORAL_TABLET | Freq: Two times a day (BID) | ORAL | 0 refills | Status: DC

## 2016-07-20 NOTE — Progress Notes (Signed)
Subjective:    Patient ID: Rhonda SnareJessica E Boden, female    DOB: 08/18/1985, 31 y.o.   MRN: [id number]  HPI  Pt in today reporting urinary symptoms for 2 weeks.  Dysuria- yes some. Frequent urination- about twice a day but feels like need to urinate. Hesitancy- no Suprapubic pressure-no  Fever-yes subjective. chills-no Nausea-mild Vomiting-no  CVA pain-rt side History of UTI- yes. Get uti in past when was sexual active. Not active for 3 years but recently has been and now symptoms. Gross hematuria-no.  Some mild odor to urine.  Pt is on depo- provera. Last got her injection 2 weeks ago. Been on for 3 months.   Review of Systems  Constitutional: Negative for chills, fatigue and fever.  Respiratory: Negative for cough, chest tightness, shortness of breath and wheezing.   Cardiovascular: Negative for chest pain and palpitations.  Gastrointestinal: Positive for nausea. Negative for abdominal distention, abdominal pain, diarrhea and vomiting.  Genitourinary: Positive for dysuria and urgency. Negative for difficulty urinating, frequency, vaginal bleeding and vaginal pain.  Musculoskeletal: Negative for back pain.       Rt sided cva pain.  Skin: Negative for rash.  Neurological: Negative for dizziness, seizures, weakness and headaches.  Hematological: Negative for adenopathy. Does not bruise/bleed easily.  Psychiatric/Behavioral: Negative for behavioral problems and confusion.    Past Medical History:  Diagnosis Date  . Acid reflux   . Anemia    iron deficiency  . Anxiety   . Bipolar disorder (HCC)    year 2009  . Bulging disc    slight  . Constipation   . Depression   . Diarrhea   . Ear infection   . Fatigue   . Headache   . Heartburn   . Liver disease    Fatty Liver Disease 2012  . OSA (obstructive sleep apnea)    mild  . PCOS (polycystic ovarian syndrome)   . Retracted ear drum   . Tachycardia April 27, 2014     Social History   Social History  .  Marital status: Single    Spouse name: N/A  . Number of children: N/A  . Years of education: N/A   Occupational History  . Not on file.   Social History Main Topics  . Smoking status: Never Smoker  . Smokeless tobacco: Never Used  . Alcohol use 0.0 oz/week     Comment: a few times a year- drink 1-2 drinks per episode  . Drug use: No  . Sexual activity: Not Currently    Birth control/ protection: Injection, IUD   Other Topics Concern  . Not on file   Social History Narrative   Has 2 dogs and 2 cats   [employer]   Completed Bachelors   Single, no children   Lives with animales   Born in [place]   ENjoys museums, historical things, movies, friends    Past Surgical History:  Procedure Laterality Date  . CHOLECYSTECTOMY N/A 07/12/2014   Procedure: LAPAROSCOPIC CHOLECYSTECTOMY WITH INTRAOPERATIVE CHOLANGIOGRAM;  Surgeon: Chevis PrettyPaul Toth III, MD;  Location: [facility] OR;  Service: General;  Laterality: N/A;  . CHOLECYSTECTOMY, LAPAROSCOPIC  07/09/2014  . CHOLESTEATOMA EXCISION Right 11/13/2015   [facility]  . COLONOSCOPY    . TYMPANOSTOMY TUBE PLACEMENT Right   . UPPER GI ENDOSCOPY    . WISDOM TOOTH EXTRACTION    . WISDOM TOOTH EXTRACTION      Family History  Problem Relation Age of Onset  . Thyroid disease Mother   .  GER disease Mother   . Hyperlipidemia Mother   . Other Mother     uterine polyp  . Hypertension Father   . Hyperlipidemia Father   . Diabetes Father     type II  . Gout Father   . Cancer Maternal Aunt     breast cancer 50's  . Drug abuse Maternal Aunt   . Cancer Paternal Uncle     lung  . Heart attack Maternal Grandmother   . Cancer Paternal Grandmother     breast 70's  . Heart disease Paternal Grandfather     heart failure  . Cancer Other     breast/ family hx of  . Diabetes Other     family hx of  . Hyperlipidemia Other     family hx of  . Hypertension Other     family hx of  . Thyroid disease Other     family hx of  . Drug abuse Maternal  Uncle   . Drug abuse Cousin   . Alcohol abuse Neg Hx   . Anxiety disorder Neg Hx   . Bipolar disorder Neg Hx   . Depression Neg Hx     Allergies  Allergen Reactions  . Iodinated Diagnostic Agents Other (See Comments)    Numbness on right side of body (only from MRI dye)  . Other Other (See Comments)    Vanilla causes migraines  . Risperidone And Related Other (See Comments)    Uncontrollable twitching  . Trazodone And Nefazodone Diarrhea and Other (See Comments)    Severe abdominal cramping, loss of appetite  . Wellbutrin [Bupropion] Other (See Comments)    Elevated BP, tacycardia, insomnia, no appetite (only when taking phentermine)   . Hydromorphone Hcl Itching, Nausea And Vomiting and Rash  . Sulfa Antibiotics Nausea And Vomiting and Rash  . Tape Itching and Rash    Paper tape ok    Current Outpatient Prescriptions on File Prior to Visit  Medication Sig Dispense Refill  . aspirin-acetaminophen-caffeine (EXCEDRIN MIGRAINE) 250-250-65 MG tablet Take 2 tablets by mouth daily as needed for headache.    . cetirizine (ZYRTEC) 10 MG tablet Take 10 mg by mouth daily.    . ciprofloxacin-dexamethasone (CIPRODEX) otic suspension Place 1 drop into the right ear as needed.    . gabapentin (NEURONTIN) 100 MG capsule Take 2 capsules (200 mg total) by mouth 3 (three) times daily. 180 capsule 1  . medroxyPROGESTERone (DEPO-PROVERA) 150 MG/ML injection Inject 1 mL (150 mg total) into the muscle once. (Patient taking differently: Inject 150 mg into the muscle every 3 (three) months. ) 1 mL 2  . metoprolol succinate (TOPROL-XL) 50 MG 24 hr tablet Take 1 tablet (50 mg total) by mouth daily. Take with or immediately following a meal. 30 tablet 2  . omeprazole (PRILOSEC) 20 MG capsule Take 20 mg by mouth daily.    Marland Kitchen PARoxetine (PAXIL) 20 MG tablet Take 1 tablet (20 mg total) by mouth daily. 30 tablet 1   No current facility-administered medications on file prior to visit.     BP 130/89 (BP  Location: Left Arm, Patient Position: Sitting, Cuff Size: Large)   Pulse 82   Temp 98.3 F (36.8 C) (Oral)   Resp 16   Ht 5' (1.524 m)   Wt 165 lb (74.8 kg)   SpO2 99%   BMI 32.22 kg/m       Objective:   Physical Exam  General Appearance- Not in acute distress.  HEENT Eyes- Scleraeral/Conjuntiva-bilat-  Not Yellow. Mouth & Throat- Normal.  Chest and Lung Exam Auscultation: Breath sounds:-Normal. Adventitious sounds:- No Adventitious sounds.  Cardiovascular Auscultation:Rythm - Regular. Heart Sounds -Normal heart sounds.  Abdomen Inspection:-Inspection Normal.  Palpation/Perucssion: Palpation and Percussion of the abdomen reveal- faint suprapubic Tendeness, No Rebound tenderness, No rigidity(Guarding) and No Palpable abdominal masses.  Liver:-Normal.  Spleen:- Normal.   Back- moderate rt cva tenderness. No left side cva pain.      Assessment & Plan:  You appear to have a urinary tract infection with possible kidney infection. I am prescribing  cipro antibiotic for the probable infection. Hydrate well. I am sending out a urine culture. During the interim if your signs and symptoms worsen rather than improving please notify us. We will notify your when the culture results are back.  Follow up in 7 days or as needed.  I did ask MA Jasmine December to send out ancillary studies as symptoms did coincide with her recently becoming sexually active again.

## 2016-07-20 NOTE — Patient Instructions (Addendum)
You appear to have a urinary tract infection with possible kidney infection. I am prescribing  cipro antibiotic for the probable infection. Hydrate well. I am sending out a urine culture. During the interim if your signs and symptoms worsen rather than improving please notify us. We will notify your when the culture results are back.  Follow up in 7 days or as needed.

## 2016-07-20 NOTE — Progress Notes (Signed)
Pre visit review using our clinic review tool, if applicable. No additional management support is needed unless otherwise documented below in the visit note/SLS  

## 2016-07-22 LAB — URINE CYTOLOGY ANCILLARY ONLY: TRICH (WINDOWPATH): NEGATIVE

## 2016-07-22 LAB — GC/CHLAMYDIA PROBE AMP
CT Probe RNA: NOT DETECTED
GC PROBE AMP APTIMA: NOT DETECTED

## 2016-07-24 LAB — CULTURE, URINE COMPREHENSIVE

## 2016-07-24 LAB — URINE CYTOLOGY ANCILLARY ONLY
Bacterial vaginitis: POSITIVE — AB
Candida vaginitis: NEGATIVE

## 2016-07-25 ENCOUNTER — Telehealth: Payer: Self-pay | Admitting: Medical

## 2016-07-25 MED ORDER — METRONIDAZOLE 500 MG PO TABS: 500.0000 mg | ORAL_TABLET | Freq: Two times a day (BID) | ORAL | 0 refills | Status: AC

## 2016-07-25 NOTE — Telephone Encounter (Signed)
Rx flagyl sent to the pharmacy.

## 2016-07-27 ENCOUNTER — Encounter: Payer: Self-pay | Admitting: Family

## 2016-07-27 ENCOUNTER — Telehealth: Payer: Self-pay | Admitting: Family

## 2016-07-27 ENCOUNTER — Ambulatory Visit (INDEPENDENT_AMBULATORY_CARE_PROVIDER_SITE_OTHER): Payer: Commercial Managed Care - PPO | Admitting: Family

## 2016-07-27 ENCOUNTER — Ambulatory Visit (HOSPITAL_BASED_OUTPATIENT_CLINIC_OR_DEPARTMENT_OTHER)
Admission: RE | Admit: 2016-07-27 | Discharge: 2016-07-27 | Disposition: A | Discharge status: Home / Self Care | Payer: Commercial Managed Care - PPO | Source: Ambulatory Visit | Attending: Family | Admitting: Family

## 2016-07-27 VITALS — BP 137/90 | HR 101 | Temp 98.8°F | Ht 62.0 in | Wt 169.2 lb

## 2016-07-27 DIAGNOSIS — R1031 Right lower quadrant pain: Secondary | ICD-10-CM | POA: Diagnosis present

## 2016-07-27 DIAGNOSIS — N200 Calculus of kidney: Secondary | ICD-10-CM | POA: Diagnosis not present

## 2016-07-27 DIAGNOSIS — M545 Low back pain, unspecified: Secondary | ICD-10-CM

## 2016-07-27 LAB — POC URINALSYSI DIPSTICK (AUTOMATED)
Bilirubin, UA: NEGATIVE
Blood, UA: NEGATIVE
GLUCOSE UA: NEGATIVE
Ketones, UA: NEGATIVE
NITRITE UA: NEGATIVE
UROBILINOGEN UA: NEGATIVE
pH, UA: 6

## 2016-07-27 MED ORDER — IOPAMIDOL (ISOVUE-300) INJECTION 61%
100.0000 mL | Freq: Once | INTRAVENOUS | Status: AC | PRN
  Administered 2016-07-27: 100 mL via INTRAVENOUS

## 2016-07-27 NOTE — Progress Notes (Signed)
Subjective:    Patient ID: Rhonda Tate, female    DOB: Oct 22, 1985, 31 y.o.   MRN: [id number]  HPI   Rhonda Tate is a 31 yr old female who presents today with chief complaint of back pain.  She was seen on 07/21/16 with enterococcus UTI and was treated with a course of cipro.  She also tested positive for BV at that time and was treated with flagyl.  GC/Chlamydia testing is negative.   She reports that she continues to have some low back pain as well as some right lower abdominal pain.  She reports dysuria last week but not longer having dysuria. She finished last dose of cipro this AM. Denies hematuria.  She reports that back pain is worse with movement.     Review of Systems See HPI  Past Medical History:  Diagnosis Date  . Acid reflux   . Anemia    iron deficiency  . Anxiety   . Bipolar disorder (HCC)    year 2009  . Bulging disc    slight  . Constipation   . Depression   . Diarrhea   . Ear infection   . Fatigue   . Headache   . Heartburn   . Liver disease    Fatty Liver Disease 2012  . OSA (obstructive sleep apnea)    mild  . PCOS (polycystic ovarian syndrome)   . Retracted ear drum   . Tachycardia April 27, 2014     Social History   Social History  . Marital status: Single    Spouse name: N/A  . Number of children: N/A  . Years of education: N/A   Occupational History  . Not on file.   Social History Main Topics  . Smoking status: Never Smoker  . Smokeless tobacco: Never Used  . Alcohol use 0.0 oz/week     Comment: a few times a year- drink 1-2 drinks per episode  . Drug use: No  . Sexual activity: Not Currently    Birth control/ protection: Injection, IUD   Other Topics Concern  . Not on file   Social History Narrative   Has 2 dogs and 2 cats   [employer]   Completed Bachelors   Single, no children   Lives with animales   Born in [place]   ENjoys museums, historical things, movies, friends    Past Surgical History:    Procedure Laterality Date  . CHOLECYSTECTOMY N/A 07/12/2014   Procedure: LAPAROSCOPIC CHOLECYSTECTOMY WITH INTRAOPERATIVE CHOLANGIOGRAM;  Surgeon: Chevis PrettyPaul Toth III, MD;  Location: [facility] OR;  Service: General;  Laterality: N/A;  . CHOLECYSTECTOMY, LAPAROSCOPIC  07/09/2014  . CHOLESTEATOMA EXCISION Right 11/13/2015   [facility]  . COLONOSCOPY    . TYMPANOSTOMY TUBE PLACEMENT Right   . UPPER GI ENDOSCOPY    . WISDOM TOOTH EXTRACTION    . WISDOM TOOTH EXTRACTION      Family History  Problem Relation Age of Onset  . Thyroid disease Mother   . GER disease Mother   . Hyperlipidemia Mother   . Other Mother     uterine polyp  . Hypertension Father   . Hyperlipidemia Father   . Diabetes Father     type II  . Gout Father   . Cancer Maternal Aunt     breast cancer 50's  . Drug abuse Maternal Aunt   . Cancer Paternal Uncle     lung  . Heart attack Maternal Grandmother   . Cancer Paternal  Grandmother     breast 70's  . Heart disease Paternal Grandfather     heart failure  . Cancer Other     breast/ family hx of  . Diabetes Other     family hx of  . Hyperlipidemia Other     family hx of  . Hypertension Other     family hx of  . Thyroid disease Other     family hx of  . Drug abuse Maternal Uncle   . Drug abuse Cousin   . Alcohol abuse Neg Hx   . Anxiety disorder Neg Hx   . Bipolar disorder Neg Hx   . Depression Neg Hx     Allergies  Allergen Reactions  . Iodinated Diagnostic Agents Other (See Comments)    Numbness on right side of body (only from MRI dye)  . Other Other (See Comments)    Vanilla causes migraines  . Risperidone And Related Other (See Comments)    Uncontrollable twitching  . Trazodone And Nefazodone Diarrhea and Other (See Comments)    Severe abdominal cramping, loss of appetite  . Wellbutrin [Bupropion] Other (See Comments)    Elevated BP, tacycardia, insomnia, no appetite (only when taking phentermine)   . Hydromorphone Hcl Itching, Nausea And Vomiting and  Rash  . Sulfa Antibiotics Nausea And Vomiting and Rash  . Tape Itching and Rash    Paper tape ok    Current Outpatient Prescriptions on File Prior to Visit  Medication Sig Dispense Refill  . aspirin-acetaminophen-caffeine (EXCEDRIN MIGRAINE) 250-250-65 MG tablet Take 2 tablets by mouth daily as needed for headache.    . cetirizine (ZYRTEC) 10 MG tablet Take 10 mg by mouth daily.    . ciprofloxacin-dexamethasone (CIPRODEX) otic suspension Place 1 drop into the right ear as needed.    . gabapentin (NEURONTIN) 100 MG capsule Take 2 capsules (200 mg total) by mouth 3 (three) times daily. 180 capsule 1  . medroxyPROGESTERone (DEPO-PROVERA) 150 MG/ML injection Inject 1 mL (150 mg total) into the muscle once. (Patient taking differently: Inject 150 mg into the muscle every 3 (three) months. ) 1 mL 2  . metoprolol succinate (TOPROL-XL) 50 MG 24 hr tablet Take 1 tablet (50 mg total) by mouth daily. Take with or immediately following a meal. 30 tablet 2  . omeprazole (PRILOSEC) 20 MG capsule Take 20 mg by mouth daily.    Marland Kitchen PARoxetine (PAXIL) 20 MG tablet Take 1 tablet (20 mg total) by mouth daily. 30 tablet 1  . phenazopyridine (PYRIDIUM) 200 MG tablet Take 1 tablet (200 mg total) by mouth 3 (three) times daily as needed for pain. 10 tablet 0  . metroNIDAZOLE (FLAGYL) 500 MG tablet Take 1 tablet (500 mg total) by mouth 2 (two) times daily. (Patient not taking: Reported on 07/27/2016) 14 tablet 0   No current facility-administered medications on file prior to visit.     BP 137/90 (BP Location: Right Arm, Patient Position: Sitting, Cuff Size: Large)   Pulse (!) 101   Temp 98.8 F (37.1 C) (Oral)   Ht 5\' 2"  (1.575 m)   Wt 169 lb 3.2 oz (76.7 kg)   SpO2 100%   BMI 30.95 kg/m       Objective:   Physical Exam  Constitutional: She appears well-developed and well-nourished.  HENT:  Head: Normocephalic and atraumatic.  Cardiovascular: Normal rate, regular rhythm and normal heart sounds.   No  murmur heard. Pulmonary/Chest: Effort normal and breath sounds normal. No respiratory distress. She has  no wheezes.  Abdominal: Soft. Bowel sounds are normal. She exhibits no distension. There is tenderness in the right lower quadrant, suprapubic area and left lower quadrant.  R CVAT.    Musculoskeletal: She exhibits no edema.  Neurological: She is alert.  Psychiatric: She has a normal mood and affect. Her behavior is normal. Judgment and thought content normal.          Assessment & Plan:  RLQ pain.  Etiology includes kidney stone, ovarian cyst, appendicitis.  Urine HCG negative. Will send for CT abd/pelvis to further evaluate. - Notes trace of leuks and trace of protein on UA.

## 2016-07-27 NOTE — Addendum Note (Signed)
Addended by: Mervin KungFERGERSON, Youssouf Shipley A on: 07/27/2016 03:22 PM   Modules accepted: Orders

## 2016-07-27 NOTE — Patient Instructions (Addendum)
Please begin metronidazole (flagyl). Complete CT scan on the first floor.  Call if new/worsening symptoms or if symptoms are not improved in 2-3 days.

## 2016-07-27 NOTE — Addendum Note (Signed)
Addended by: Verdie ShireBAYNES, ANGELA M on: 07/27/2016 03:07 PM   Modules accepted: Orders

## 2016-07-27 NOTE — Progress Notes (Signed)
Pre visit review using our clinic review tool, if applicable. No additional management support is needed unless otherwise documented below in the visit note. Finished

## 2016-07-27 NOTE — Telephone Encounter (Signed)
Caller name: Relationship to patient: Self Can be reached: [phone] Pharmacy:  Reason for call: Patient states she saw Ramon Dredged[facility] last week and was dx with a kidney infection. Patient states she is still having back pain and her job wants her to travel to [place] and [place] this week. Request a note stating that she can not travel long distance because of her back pain. Plse adv

## 2016-07-28 ENCOUNTER — Encounter: Payer: Self-pay | Admitting: Family

## 2016-07-28 ENCOUNTER — Other Ambulatory Visit: Payer: Self-pay | Admitting: Family

## 2016-07-28 LAB — URINE CULTURE: ORGANISM ID, BACTERIA: NO GROWTH

## 2016-07-28 MED ORDER — MELOXICAM 7.5 MG PO TABS: 7.5000 mg | ORAL_TABLET | Freq: Every day | ORAL | 0 refills | Status: AC

## 2016-07-28 NOTE — Progress Notes (Signed)
See mychart.  

## 2016-08-20 ENCOUNTER — Encounter (HOSPITAL_COMMUNITY): Payer: Self-pay

## 2016-08-20 ENCOUNTER — Ambulatory Visit (INDEPENDENT_AMBULATORY_CARE_PROVIDER_SITE_OTHER): Payer: Commercial Managed Care - PPO | Admitting: Psychiatry

## 2016-08-20 ENCOUNTER — Encounter (HOSPITAL_COMMUNITY): Payer: Self-pay | Admitting: Psychiatry

## 2016-08-20 VITALS — BP 120/68 | HR 79 | Ht 60.0 in | Wt 168.4 lb

## 2016-08-20 DIAGNOSIS — F4310 Post-traumatic stress disorder, unspecified: Secondary | ICD-10-CM

## 2016-08-20 DIAGNOSIS — F410 Panic disorder [episodic paroxysmal anxiety] without agoraphobia: Secondary | ICD-10-CM | POA: Diagnosis not present

## 2016-08-20 DIAGNOSIS — F4010 Social phobia, unspecified: Secondary | ICD-10-CM | POA: Diagnosis not present

## 2016-08-20 DIAGNOSIS — Z79899 Other long term (current) drug therapy: Secondary | ICD-10-CM

## 2016-08-20 DIAGNOSIS — F332 Major depressive disorder, recurrent severe without psychotic features: Secondary | ICD-10-CM | POA: Diagnosis not present

## 2016-08-20 DIAGNOSIS — Z813 Family history of other psychoactive substance abuse and dependence: Secondary | ICD-10-CM

## 2016-08-20 MED ORDER — GABAPENTIN 100 MG PO CAPS: 200.0000 mg | ORAL_CAPSULE | Freq: Three times a day (TID) | ORAL | 1 refills | Status: DC

## 2016-08-20 MED ORDER — ALPRAZOLAM 0.5 MG PO TABS: 0.5000 mg | ORAL_TABLET | Freq: Two times a day (BID) | ORAL | 1 refills | Status: DC | PRN

## 2016-08-20 MED ORDER — VILAZODONE HCL 20 MG PO TABS: 20.0000 mg | ORAL_TABLET | ORAL | 1 refills | Status: DC
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
## 2016-08-29 IMAGING — NM NM HEPATO W/GB/PHARM/[PERSON_NAME]
3 series · 13 of 13 positions shown · non-contrast
Comparison: Ultrasound, 05/09/2014

CLINICAL DATA: RUQ pain, nausea, vomiting, intolerance to fatty
foods.

EXAM:
NUCLEAR MEDICINE HEPATOBILIARY IMAGING WITH GALLBLADDER EF
TECHNIQUE: Sequential images of the abdomen were obtained [DATE] minutes
following intravenous administration of radiopharmaceutical. After
slow intravenous infusion of 1.59 micrograms Cholecystokinin,
gallbladder ejection fraction was determined.
RADIOPHARMACEUTICALS:  5.0 Millicurie Ic-JJm Choletec

[he hepatobiliary · 1 of 1 slices shown (1 of 3)]
[im 1/1]
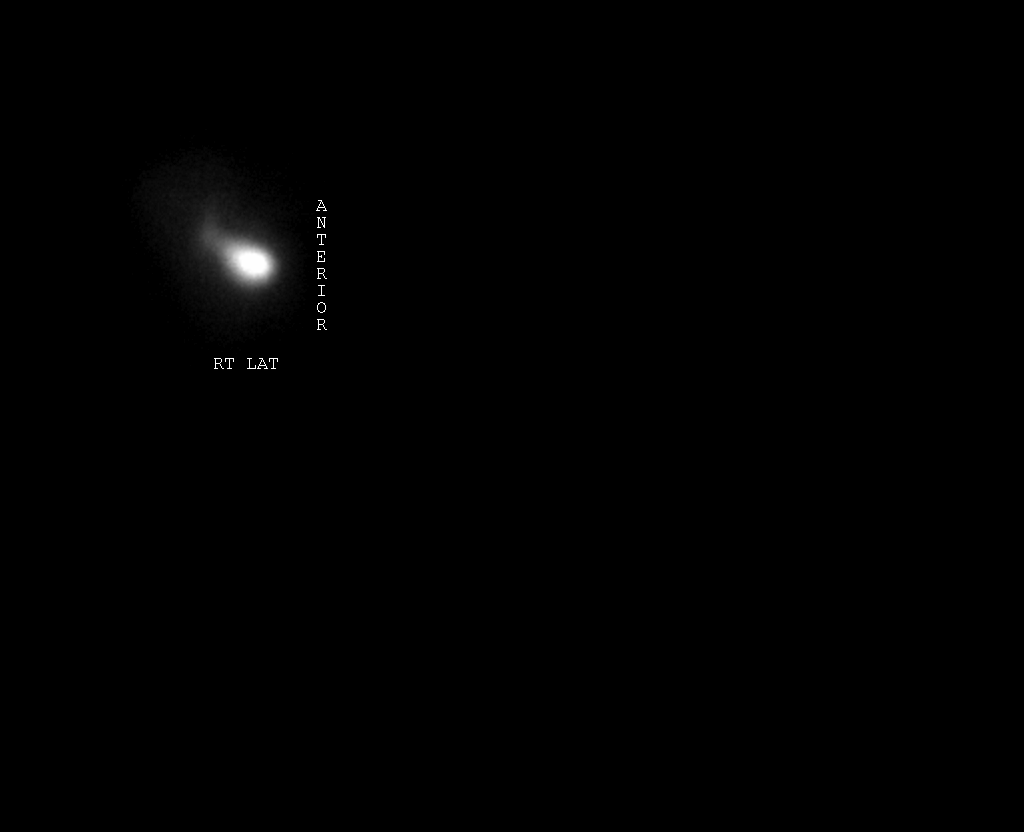

[he hepatobiliary · 3.43mm/px · 6 of 30 frames shown (2 of 3)]
[frame 3/30]
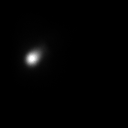
[frame 8/30]
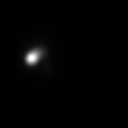
[frame 13/30]
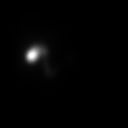
[frame 18/30]
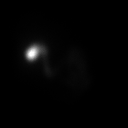
[frame 23/30]
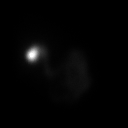
[frame 28/30]
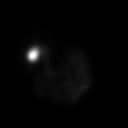

[he hepatobiliary · 3.43mm/px · 6 of 46 frames shown (3 of 3)]
[frame 4/46]
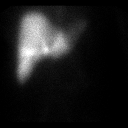
[frame 12/46]
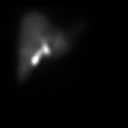
[frame 20/46]
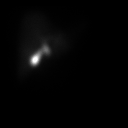
[frame 27/46]
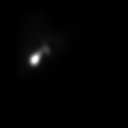
[frame 35/46]
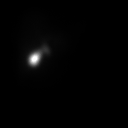
[frame 43/46]
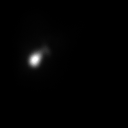

[13 of 13 positions shown; findings below may reference images not displayed]

FINDINGS: There is homogeneous accumulation of radiotracer by the liver.
Prompt excretion is noted into the intra and extrahepatic biliary
tree with early filling of the gallbladder indicating patency of the
cystic duct. Small bowel activity was seen later during the exam
indicating patency of the common bile duct. Gallbladder function was
then assessed with gallbladder contraction stimulated with a CCK
infusion. Gallbladder ejection fraction was calculated at 46%. At 30
min, normal ejection fraction is greater than 30%.

The patient experienced some right upper quadrant pressure during
CCK infusion.
IMPRESSION: Normal hepatobiliary imaging with normal gallbladder function.

## 2016-10-08 ENCOUNTER — Encounter (HOSPITAL_COMMUNITY): Payer: Self-pay | Admitting: Psychiatry

## 2016-10-08 ENCOUNTER — Ambulatory Visit (INDEPENDENT_AMBULATORY_CARE_PROVIDER_SITE_OTHER): Payer: Commercial Managed Care - PPO | Admitting: Psychiatry

## 2016-10-08 DIAGNOSIS — F410 Panic disorder [episodic paroxysmal anxiety] without agoraphobia: Secondary | ICD-10-CM

## 2016-10-08 DIAGNOSIS — Z7982 Long term (current) use of aspirin: Secondary | ICD-10-CM

## 2016-10-08 DIAGNOSIS — F4310 Post-traumatic stress disorder, unspecified: Secondary | ICD-10-CM | POA: Diagnosis not present

## 2016-10-08 DIAGNOSIS — F4010 Social phobia, unspecified: Secondary | ICD-10-CM | POA: Diagnosis not present

## 2016-10-08 DIAGNOSIS — Z79899 Other long term (current) drug therapy: Secondary | ICD-10-CM | POA: Diagnosis not present

## 2016-10-08 DIAGNOSIS — F332 Major depressive disorder, recurrent severe without psychotic features: Secondary | ICD-10-CM

## 2016-10-08 MED ORDER — VILAZODONE HCL 40 MG PO TABS: 40.0000 mg | ORAL_TABLET | Freq: Every day | ORAL | 0 refills | Status: AC

## 2016-10-08 MED ORDER — ALPRAZOLAM 0.5 MG PO TABS: 0.5000 mg | ORAL_TABLET | Freq: Two times a day (BID) | ORAL | 2 refills | Status: AC | PRN

## 2016-10-08 MED ORDER — GABAPENTIN 300 MG PO CAPS: 300.0000 mg | ORAL_CAPSULE | Freq: Three times a day (TID) | ORAL | 0 refills | Status: AC

## 2016-10-08 NOTE — Progress Notes (Signed)
[facility] MD/PA/NP OP Progress Note  10/08/2016 4:21 PM Rhonda Tate  MRN:  [id number]  Chief Complaint:  Chief Complaint    Follow-up     HPI: Pt states for the first week on Viibryd she felt great. Now she is feeling depressed daily. She reports frequent crying spells, insomnia, low energy, overwhelmed and irritable. States she is lashing out at people for no reason.  Denies SI/HI.    PTSD- fine, no issues in since last visit.  Anxiety is up and she feels anxious all the time. She got a great new job. She is moving into a new house soon. Pt is having panic attacks 2-3x/week.  Taking meds as prescribed and denies SE.     Visit Diagnosis:    ICD-9-CM ICD-10-CM   1. Severe episode of recurrent major depressive disorder, without psychotic features (HCC) 296.33 F33.2 Vilazodone HCl (VIIBRYD) 40 MG TABS  2. Social anxiety disorder 300.23 F40.10 gabapentin (NEURONTIN) 300 MG capsule  3. PTSD (post-traumatic stress disorder) 309.81 F43.10 gabapentin (NEURONTIN) 300 MG capsule  4. Panic disorder without agoraphobia 300.01 F41.0 gabapentin (NEURONTIN) 300 MG capsule       Past Psychiatric History:  Anxiety: Yes Bipolar Disorder: No Depression: Yes Mania: No Psychosis: No Schizophrenia: No Personality Disorder: No Hospitalization for psychiatric illness: No History of Electroconvulsive Shock Therapy: No Prior Suicide Attempts: No  Reports hx of SIB by cutting at the age of 87.     Past Medical History:  Past Medical History:  Diagnosis Date  . Acid reflux   . Anemia    iron deficiency  . Anxiety   . Bipolar disorder (HCC)    year 2009  . Bulging disc    slight  . Constipation   . Depression   . Diarrhea   . Ear infection   . Fatigue   . Headache   . Heartburn   . Liver disease    Fatty Liver Disease 2012  . OSA (obstructive sleep apnea)    mild  . PCOS (polycystic ovarian syndrome)   . Retracted ear drum   . Tachycardia April 27, 2014    Past Surgical  History:  Procedure Laterality Date  . CHOLECYSTECTOMY N/A 07/12/2014   Procedure: LAPAROSCOPIC CHOLECYSTECTOMY WITH INTRAOPERATIVE CHOLANGIOGRAM;  Surgeon: Chevis Pretty III, MD;  Location: [facility] OR;  Service: General;  Laterality: N/A;  . CHOLECYSTECTOMY, LAPAROSCOPIC  07/09/2014  . CHOLESTEATOMA EXCISION Right 11/13/2015   [facility]  . COLONOSCOPY    . TYMPANOSTOMY TUBE PLACEMENT Right   . UPPER GI ENDOSCOPY    . WISDOM TOOTH EXTRACTION    . WISDOM TOOTH EXTRACTION      Family Psychiatric and Medical History: Family History  Problem Relation Age of Onset  . Thyroid disease Mother   . GER disease Mother   . Hyperlipidemia Mother   . Other Mother     uterine polyp  . Hypertension Father   . Hyperlipidemia Father   . Diabetes Father     type II  . Gout Father   . Cancer Maternal Aunt     breast cancer 50's  . Drug abuse Maternal Aunt   . Cancer Paternal Uncle     lung  . Heart attack Maternal Grandmother   . Cancer Paternal Grandmother     breast 70's  . Heart disease Paternal Grandfather     heart failure  . Cancer Other     breast/ family hx of  . Diabetes Other  family hx of  . Hyperlipidemia Other     family hx of  . Hypertension Other     family hx of  . Thyroid disease Other     family hx of  . Drug abuse Maternal Uncle   . Drug abuse Cousin   . Alcohol abuse Neg Hx   . Anxiety disorder Neg Hx   . Bipolar disorder Neg Hx   . Depression Neg Hx     Social History:  Social History   Social History  . Marital status: Single    Spouse name: N/A  . Number of children: N/A  . Years of education: N/A   Social History Main Topics  . Smoking status: Never Smoker  . Smokeless tobacco: Never Used  . Alcohol use 0.0 oz/week     Comment: a few times a year- drink 1-2 drinks per episode  . Drug use: No  . Sexual activity: Not Currently    Birth control/ protection: Injection, IUD   Other Topics Concern  . 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 Iodinated Diagnostic Agents Other (See Comments)    Numbness on right side of body (only from MRI dye)  . Other Other (See Comments)    Vanilla causes migraines  . Risperidone And Related Other (See Comments)    Uncontrollable twitching  . Trazodone And Nefazodone Diarrhea and Other (See Comments)    Severe abdominal cramping, loss of appetite  . Wellbutrin [Bupropion] Other (See Comments)    Elevated BP, tacycardia, insomnia, no appetite (only when taking phentermine)   . Hydromorphone Hcl Itching, Nausea And Vomiting and Rash  . Sulfa Antibiotics Nausea And Vomiting and Rash  . Tape Itching and Rash    Paper tape ok    Metabolic Disorder Labs: Lab Results  Component Value Date   HGBA1C 5.5 10/25/2013   MPG 111 10/25/2013   MPG 111 04/15/2012   No results found for: PROLACTIN Lab Results  Component Value Date   CHOL 164 10/30/2014   TRIG 236.0 (H) 10/30/2014   HDL 39.20 10/30/2014   CHOLHDL 4 10/30/2014   VLDL 47.2 (H) 10/30/2014   LDLCALC 94 10/18/2013     Current Medications: Current Outpatient Prescriptions  Medication Sig Dispense Refill  . ALPRAZolam (XANAX) 0.5 MG tablet Take 1 tablet (0.5 mg total) by mouth 2 (two) times daily as needed for anxiety. 60 tablet 1  . aspirin-acetaminophen-caffeine (EXCEDRIN MIGRAINE) 250-250-65 MG tablet Take 2 tablets by mouth daily as needed for headache.    . cetirizine (ZYRTEC) 10 MG tablet Take 10 mg by mouth daily.    . ciprofloxacin-dexamethasone (CIPRODEX) otic suspension Place 1 drop into the right ear as needed.    . gabapentin (NEURONTIN) 100 MG capsule Take 2 capsules (200 mg total) by mouth 3 (three) times daily. 180 capsule 1  . medroxyPROGESTERone (DEPO-PROVERA) 150 MG/ML injection Inject 1 mL  (150 mg total) into the muscle once. (Patient taking differently: Inject 150 mg into the muscle every 3 (three) months. ) 1 mL 2  . meloxicam (MOBIC) 7.5 MG tablet Take 1 tablet (7.5 mg total) by mouth daily. (Patient not taking: Reported on 08/20/2016) 14 tablet 0  .  metoprolol succinate (TOPROL-XL) 50 MG 24 hr tablet Take 1 tablet (50 mg total) by mouth daily. Take with or immediately following a meal. 30 tablet 2  . metroNIDAZOLE (FLAGYL) 500 MG tablet Take 1 tablet (500 mg total) by mouth 2 (two) times daily. (Patient not taking: Reported on 07/27/2016) 14 tablet 0  . omeprazole (PRILOSEC) 20 MG capsule Take 20 mg by mouth daily.    . phenazopyridine (PYRIDIUM) 200 MG tablet Take 1 tablet (200 mg total) by mouth 3 (three) times daily as needed for pain. (Patient not taking: Reported on 08/20/2016) 10 tablet 0  . Vilazodone HCl (VIIBRYD) 20 MG TABS Take 1 tablet (20 mg total) by mouth every morning. 30 tablet 1   No current facility-administered medications for this visit.      Musculoskeletal: Strength & Muscle Tone: within normal limits Gait & Station: normal Patient leans: N/A  Psychiatric Specialty Exam: Review of Systems  Musculoskeletal: Positive for myalgias. Negative for back pain, joint pain and neck pain.  Neurological: Negative for dizziness, tingling, sensory change and headaches.  Psychiatric/Behavioral: Positive for depression. Negative for hallucinations, substance abuse and suicidal ideas. The patient is nervous/anxious and has insomnia.     There were no vitals taken for this visit.There is no height or weight on file to calculate BMI.  General Appearance: Fairly Groomed  Eye Contact:  Good  Speech:  Clear and Coherent and Normal Rate  Volume:  Normal  Mood:  Anxious and Depressed  Affect:  Congruent and Tearful  Thought Process:  Goal Directed and Descriptions of Associations: Intact  Orientation:  Full (Time, Place, and Person)  Thought Content: Logical   Suicidal  Thoughts:  No  Homicidal Thoughts:  No  Memory:  Immediate;   Good Recent;   Good Remote;   Good  Judgement:  Good  Insight:  Good  Psychomotor Activity:  Normal  Concentration:  Concentration: Good and Attention Span: Good  Recall:  Good  Fund of Knowledge: Good  Language: Good  Akathisia:  No  Handed:  Right  AIMS (if indicated):  n/a  Assets:  Communication Skills Desire for Improvement Housing  ADL's:  Intact  Cognition: WNL  Sleep:  poor     Treatment Plan Summary:Medication management   Assessment: MDD-recurrent, moderate vs Bipolar II; Social Anxiety disorder; PTSD; Panic disorder without agorophobia   Medication management with supportive therapy. Risks/benefits and SE of the medication discussed. Pt verbalized understanding and verbal consent obtained for treatment.  Affirm with the patient that the medications are taken as ordered. Patient expressed understanding of how their medications were to be used.    Meds: increase Viibryd 40mg  po qD for depression and anxiety and PTSD Increase Neurontin to 300mg  po TID for anxiety Xanax 0.5mg  po BID prn anxiety   Labs: none   Therapy: brief supportive therapy provided. Discussed psychosocial stressors in detail.     Consultations: encouraged to continue individual therapy  Pt denies SI and is at an acute low risk for suicide. Patient told to call clinic if any problems occur. Patient advised to go to ER if they should develop SI/HI, side effects, or if symptoms worsen. Has crisis numbers to call if needed. Pt verbalized understanding.  F/up in 2 months or sooner if needed    Oletta DarterSalina Zakiah Gauthreaux, MD 10/08/2016, 4:21 PM

## 2016-10-09 ENCOUNTER — Ambulatory Visit (INDEPENDENT_AMBULATORY_CARE_PROVIDER_SITE_OTHER): Payer: Commercial Managed Care - PPO

## 2016-10-09 DIAGNOSIS — Z3042 Encounter for surveillance of injectable contraceptive: Secondary | ICD-10-CM

## 2016-10-09 MED ORDER — MEDROXYPROGESTERONE ACETATE 150 MG/ML IM SUSP
150.0000 mg | Freq: Once | INTRAMUSCULAR | Status: AC
  Administered 2016-10-09: 150 mg via INTRAMUSCULAR

## 2016-10-09 NOTE — Progress Notes (Signed)
Patient received Depo Provera 150mg  IM in Right upper outer quadrant. Armandina StammerJennifer Howard RNBSN

## 2016-10-29 ENCOUNTER — Telehealth (HOSPITAL_COMMUNITY): Payer: Self-pay

## 2016-10-29 NOTE — Telephone Encounter (Signed)
Medicaiton management - Called patient to inform of Dr. Lemar LoftyAgarwal's instructions she could stop Viibryd and then call back in a few weeks to check in and see if any further medicaiton adjustments would be needed.  Patient agreed with plan and will cal back if any worsening of symptoms prior till then.

## 2016-10-29 NOTE — Telephone Encounter (Signed)
Called a left a voice message for pt to call back.  Shawn or Regin- if pt calls back I recommend she stop the Viibryd. We will discuss other options for treatment after a couple of weeks.

## 2016-10-29 NOTE — Telephone Encounter (Signed)
Medication Problem - Telephone message left for patient thsi nurse received her message that since starting Viibryd she was feeling more depressed, increased crying and irritability and just did not like the way the medication was making her feel.  Informed message would be sent to Dr. Michae KavaAgarwal as patient requests a medication change to help with current increased symptoms of depression.  Patient returns next on 12/10/16.

## 2016-11-09 ENCOUNTER — Ambulatory Visit: Payer: Self-pay | Admitting: Family

## 2016-12-10 ENCOUNTER — Ambulatory Visit (HOSPITAL_COMMUNITY): Payer: Self-pay | Admitting: Psychiatry

## 2016-12-10 ENCOUNTER — Ambulatory Visit (HOSPITAL_COMMUNITY): Payer: Self-pay | Admitting: Psychology

## 2017-01-04 ENCOUNTER — Telehealth: Payer: Self-pay | Admitting: *Deleted

## 2017-01-04 NOTE — Telephone Encounter (Signed)
Received Physician Orders for Confirmation of VO for [facility] from [facility], forwarded to covering provider/SLS 07/30

## 2017-03-02 ENCOUNTER — Telehealth: Payer: Self-pay | Admitting: *Deleted

## 2017-03-02 NOTE — Telephone Encounter (Signed)
Received Confirmation of Verbal Physician Orders from [facility] [x2] for CPAP Supplies; forwarded to provider/SLS 09/25
